# Patient Record
Sex: Male | Born: 1955 | Race: Black or African American | Hispanic: No | Marital: Married | Smoking: Current every day smoker
Health system: Southern US, Community
[De-identification: ages and names within clinical notes are randomized; demographics above are authoritative.]

## PROBLEM LIST (undated history)

## (undated) DIAGNOSIS — E876 Hypokalemia: Secondary | ICD-10-CM

## (undated) DIAGNOSIS — M899 Disorder of bone, unspecified: Secondary | ICD-10-CM

## (undated) DIAGNOSIS — E119 Type 2 diabetes mellitus without complications: Secondary | ICD-10-CM

## (undated) DIAGNOSIS — K831 Obstruction of bile duct: Secondary | ICD-10-CM

## (undated) DIAGNOSIS — E109 Type 1 diabetes mellitus without complications: Secondary | ICD-10-CM

## (undated) DIAGNOSIS — D329 Benign neoplasm of meninges, unspecified: Secondary | ICD-10-CM

## (undated) DIAGNOSIS — I10 Essential (primary) hypertension: Secondary | ICD-10-CM

## (undated) DIAGNOSIS — C250 Malignant neoplasm of head of pancreas: Secondary | ICD-10-CM

## (undated) DIAGNOSIS — K219 Gastro-esophageal reflux disease without esophagitis: Secondary | ICD-10-CM

## (undated) DIAGNOSIS — M51379 Other intervertebral disc degeneration, lumbosacral region without mention of lumbar back pain or lower extremity pain: Secondary | ICD-10-CM

## (undated) DIAGNOSIS — G629 Polyneuropathy, unspecified: Secondary | ICD-10-CM

## (undated) HISTORY — PX: EYE SURGERY: SHX253

## (undated) HISTORY — PX: CRANIOTOMY FOR TUMOR: SUR345

## (undated) HISTORY — PX: COLONOSCOPY: SHX174

## (undated) HISTORY — PX: ESOPHAGOGASTRODUODENOSCOPY: SHX1529

---

## 2012-12-24 ENCOUNTER — Ambulatory Visit: Payer: Self-pay | Admitting: Internal Medicine

## 2012-12-24 LAB — CBC CANCER CENTER
Basophil #: 0.1 x10 3/mm (ref 0.0–0.1)
Eosinophil %: 1.4 %
HCT: 45.2 % (ref 40.0–52.0)
Lymphocyte #: 4.1 x10 3/mm — ABNORMAL HIGH (ref 1.0–3.6)
MCH: 31.2 pg (ref 26.0–34.0)
MCV: 94 fL (ref 80–100)
Monocyte #: 1 x10 3/mm (ref 0.2–1.0)
Neutrophil #: 8.7 x10 3/mm — ABNORMAL HIGH (ref 1.4–6.5)
Neutrophil %: 61.5 %
Platelet: 158 x10 3/mm (ref 150–440)
RBC: 4.83 10*6/uL (ref 4.40–5.90)
RDW: 13.6 % (ref 11.5–14.5)
WBC: 14.1 x10 3/mm — ABNORMAL HIGH (ref 3.8–10.6)

## 2013-01-15 ENCOUNTER — Ambulatory Visit: Payer: Self-pay | Admitting: Internal Medicine

## 2013-01-21 LAB — IRON AND TIBC
Iron Bind.Cap.(Total): 382 ug/dL (ref 250–450)
Iron Saturation: 25 %
Iron: 97 ug/dL (ref 65–175)
Unbound Iron-Bind.Cap.: 285 ug/dL

## 2013-01-21 LAB — CBC CANCER CENTER
Basophil #: 0.1 x10 3/mm (ref 0.0–0.1)
Basophil %: 0.8 %
Eosinophil #: 0.2 x10 3/mm (ref 0.0–0.7)
Eosinophil %: 1.3 %
HCT: 44.6 % (ref 40.0–52.0)
HGB: 14.8 g/dL (ref 13.0–18.0)
Lymphocyte #: 5.1 x10 3/mm — ABNORMAL HIGH (ref 1.0–3.6)
Lymphocyte %: 39.3 %
MCH: 31.5 pg (ref 26.0–34.0)
MCHC: 33.1 g/dL (ref 32.0–36.0)
MCV: 95 fL (ref 80–100)
MONOS PCT: 7.2 %
Monocyte #: 0.9 x10 3/mm (ref 0.2–1.0)
NEUTROS ABS: 6.6 x10 3/mm — AB (ref 1.4–6.5)
Neutrophil %: 51.4 %
Platelet: 138 x10 3/mm — ABNORMAL LOW (ref 150–440)
RBC: 4.7 10*6/uL (ref 4.40–5.90)
RDW: 13.6 % (ref 11.5–14.5)
WBC: 12.9 x10 3/mm — AB (ref 3.8–10.6)

## 2013-01-21 LAB — FERRITIN: FERRITIN (ARMC): 384 ng/mL (ref 8–388)

## 2013-01-28 LAB — CBC CANCER CENTER
BASOS PCT: 1 %
Basophil #: 0.1 x10 3/mm (ref 0.0–0.1)
Eosinophil #: 0.2 x10 3/mm (ref 0.0–0.7)
Eosinophil %: 1.5 %
HCT: 43.3 % (ref 40.0–52.0)
HGB: 14.5 g/dL (ref 13.0–18.0)
LYMPHS ABS: 4.3 x10 3/mm — AB (ref 1.0–3.6)
LYMPHS PCT: 34.8 %
MCH: 31.7 pg (ref 26.0–34.0)
MCHC: 33.5 g/dL (ref 32.0–36.0)
MCV: 95 fL (ref 80–100)
Monocyte #: 0.9 x10 3/mm (ref 0.2–1.0)
Monocyte %: 7.5 %
NEUTROS PCT: 55.2 %
Neutrophil #: 6.9 x10 3/mm — ABNORMAL HIGH (ref 1.4–6.5)
PLATELETS: 137 x10 3/mm — AB (ref 150–440)
RBC: 4.57 10*6/uL (ref 4.40–5.90)
RDW: 14 % (ref 11.5–14.5)
WBC: 12.5 x10 3/mm — ABNORMAL HIGH (ref 3.8–10.6)

## 2013-02-11 LAB — CBC CANCER CENTER
BASOS ABS: 0.1 x10 3/mm (ref 0.0–0.1)
Basophil %: 0.9 %
Eosinophil #: 0.1 x10 3/mm (ref 0.0–0.7)
Eosinophil %: 1.5 %
HCT: 46.9 % (ref 40.0–52.0)
HGB: 15.7 g/dL (ref 13.0–18.0)
LYMPHS PCT: 30.2 %
Lymphocyte #: 2.9 x10 3/mm (ref 1.0–3.6)
MCH: 31.8 pg (ref 26.0–34.0)
MCHC: 33.5 g/dL (ref 32.0–36.0)
MCV: 95 fL (ref 80–100)
Monocyte #: 0.7 x10 3/mm (ref 0.2–1.0)
Monocyte %: 7.7 %
NEUTROS ABS: 5.7 x10 3/mm (ref 1.4–6.5)
Neutrophil %: 59.7 %
PLATELETS: 158 x10 3/mm (ref 150–440)
RBC: 4.94 10*6/uL (ref 4.40–5.90)
RDW: 13.7 % (ref 11.5–14.5)
WBC: 9.6 x10 3/mm (ref 3.8–10.6)

## 2013-02-11 IMAGING — US ABDOMEN ULTRASOUND
1 series · 14 of 25 positions shown · non-contrast
Comparison: None.

CLINICAL DATA: Thrombocytopenia

EXAM:
ULTRASOUND ABDOMEN COMPLETE

[Series 1: abdomen ultrasound · 0.28mm/px · 14 of 90 slices shown]
[im 1/90]
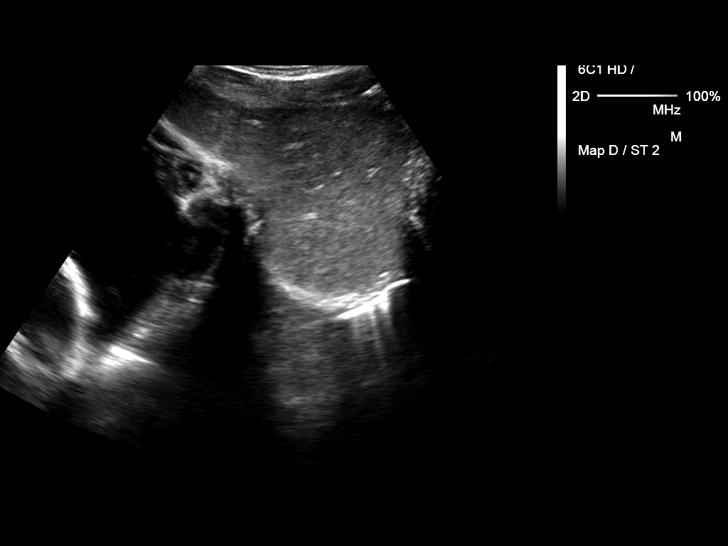
[im 8/90]
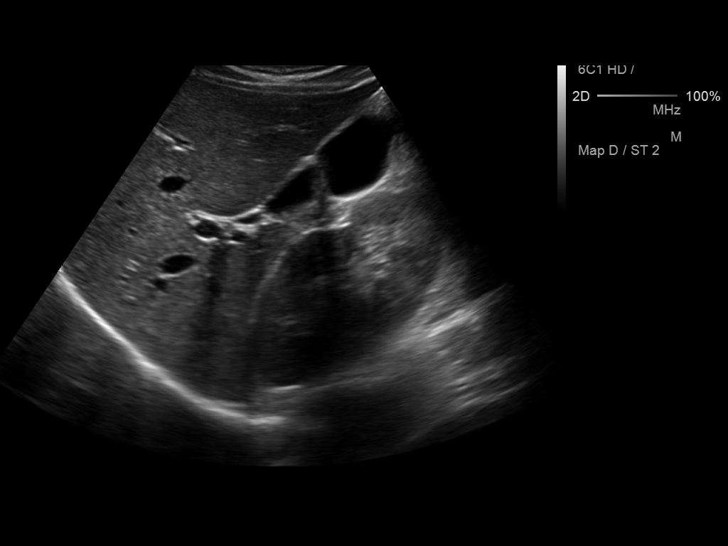
[im 15/90]
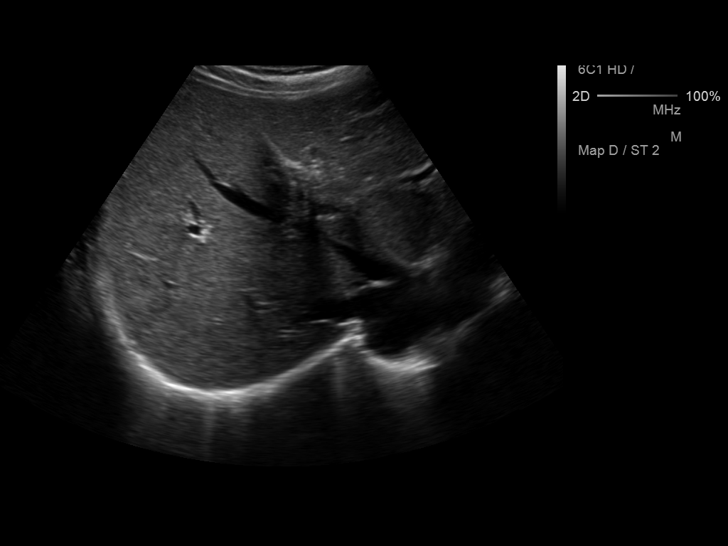
[im 23/90]
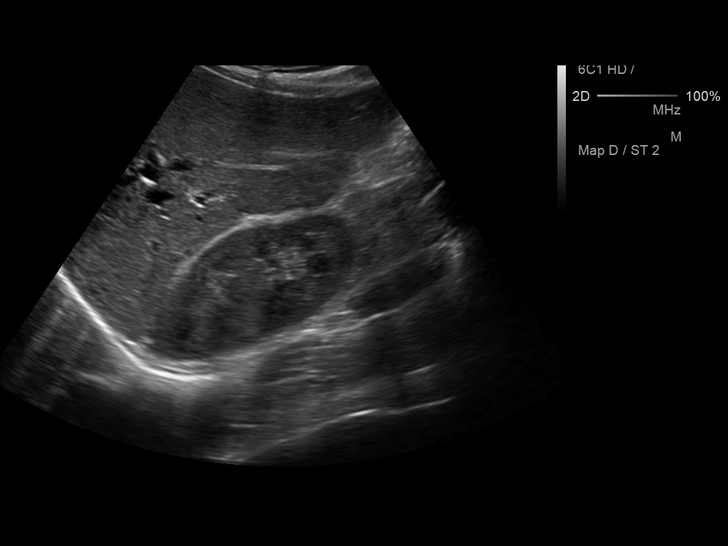
[im 30/90]
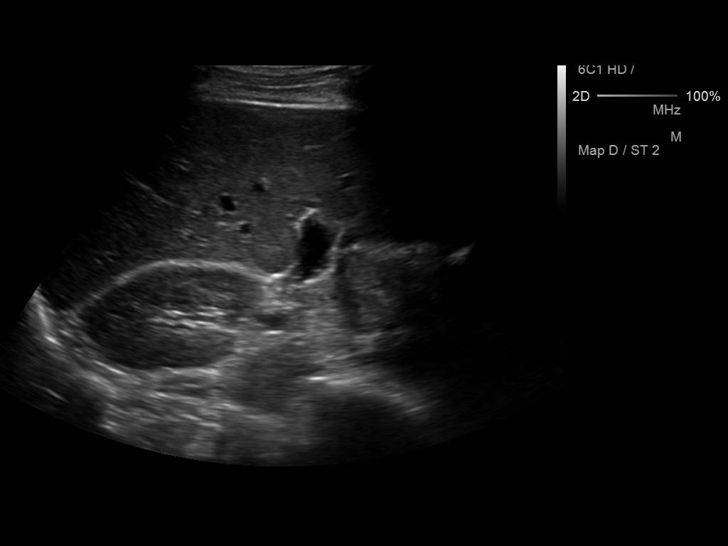
[im 34/90]
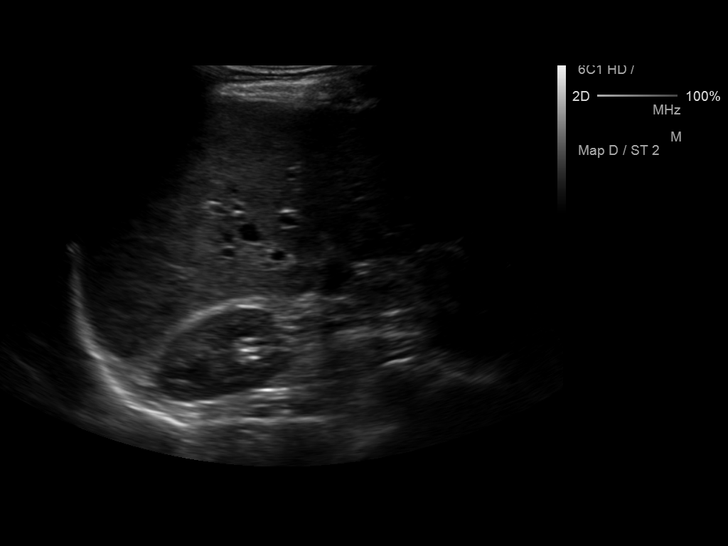
[im 41/90]
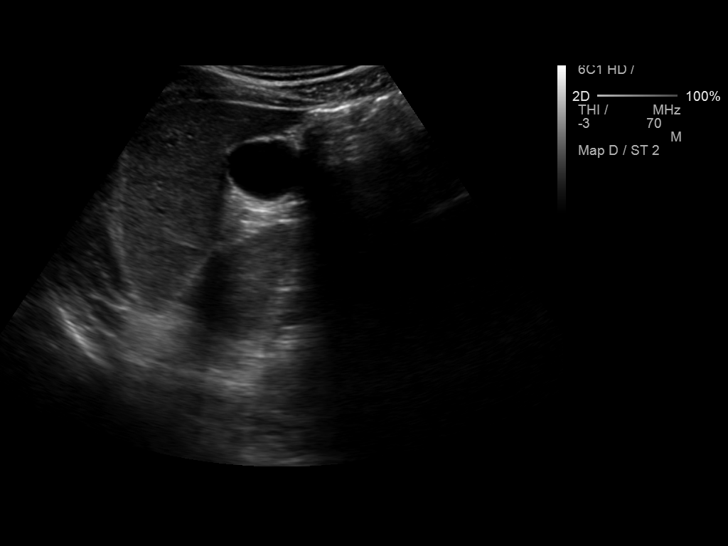
[im 49/90]
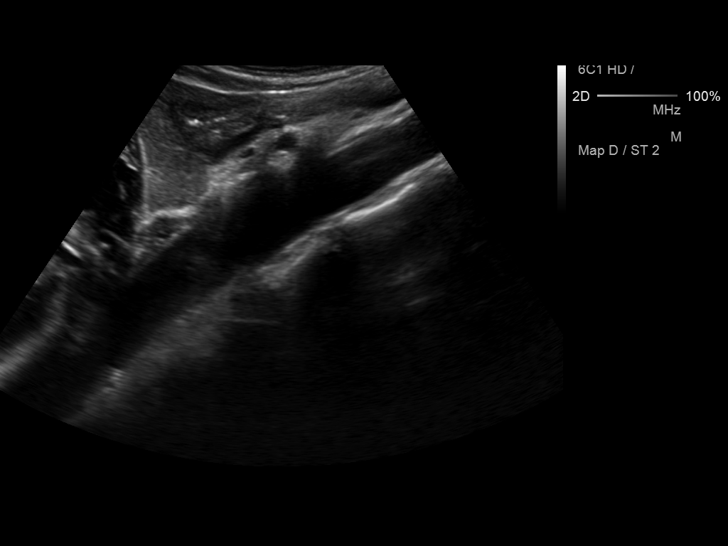
[im 56/90]
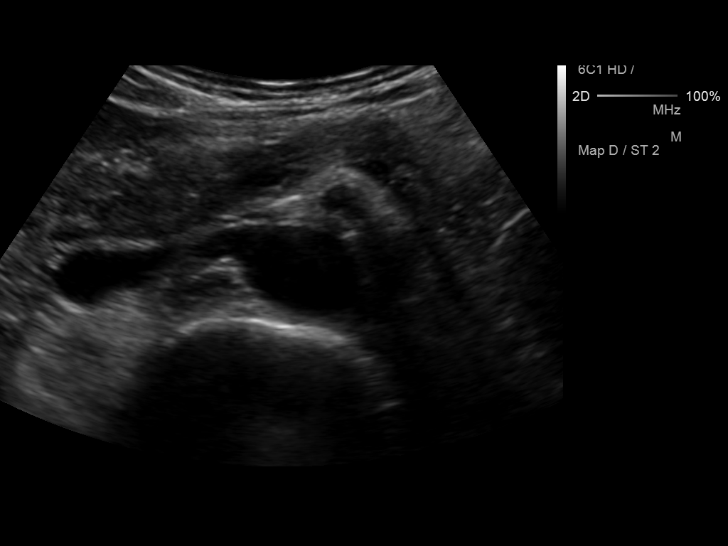
[im 60/90]
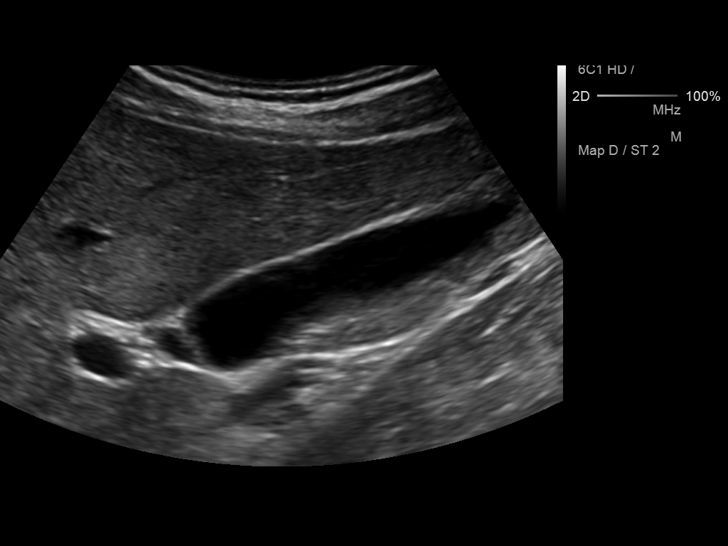
[im 67/90]
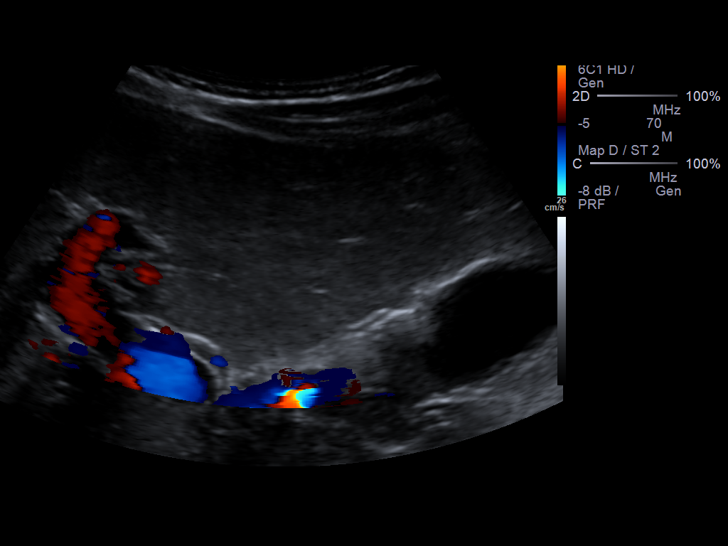
[im 75/90]
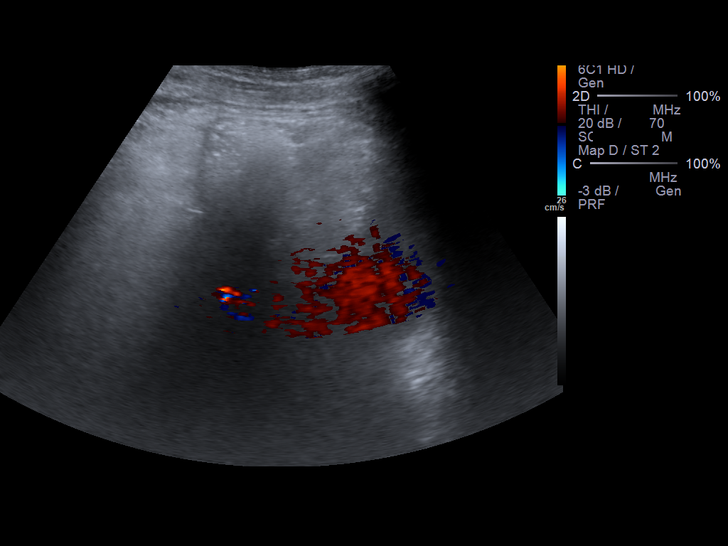
[im 82/90]
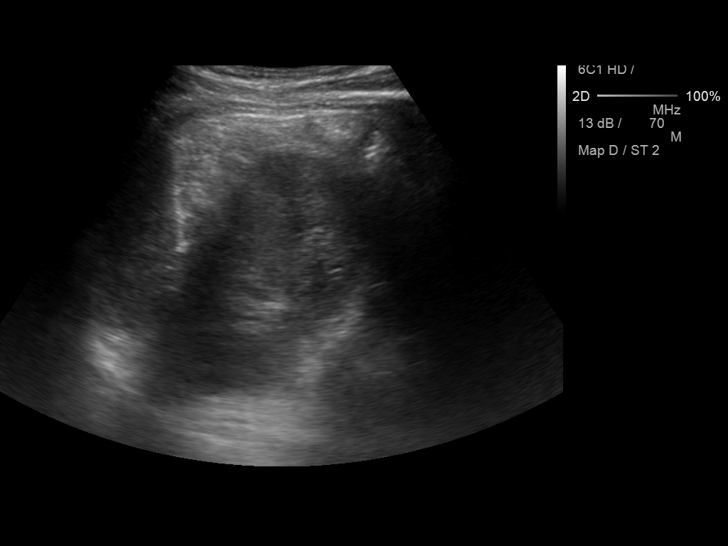
[im 90/90]
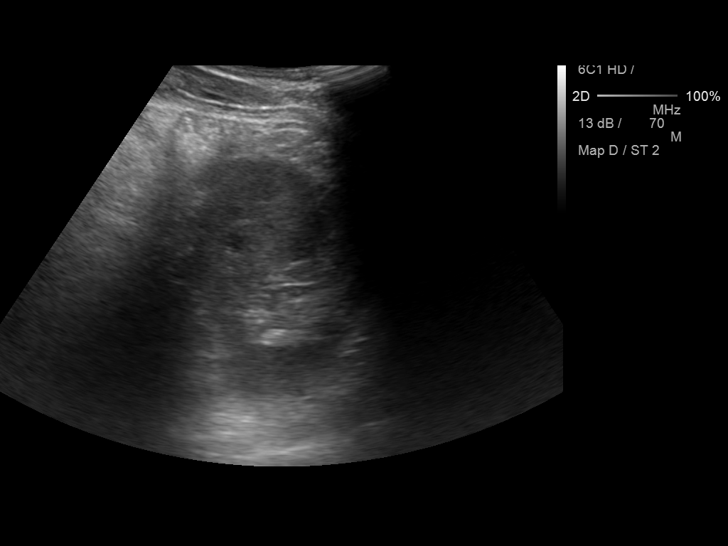

[14 of 25 positions shown; findings below may reference images not displayed]

FINDINGS: Gallbladder:

No gallstones or wall thickening visualized. No sonographic Murphy
sign noted.

Common bile duct:

Diameter: 1.7 mm.

Liver:

No focal lesion identified. Within normal limits in parenchymal
echogenicity.

IVC:

No abnormality visualized.

Pancreas:

Visualized portion unremarkable.

Spleen:

Size and appearance within normal limits.

Right Kidney:

Length: 10.2 cm. Echogenicity within normal limits. No mass or
hydronephrosis visualized.

Left Kidney:

Length: 8.9 cm. Echogenicity within normal limits. No mass or
hydronephrosis visualized.

Abdominal aorta:

No aneurysm visualized.

Other findings:

None.
IMPRESSION: Unremarkable abdominal ultrasound.

## 2013-02-15 ENCOUNTER — Ambulatory Visit: Payer: Self-pay | Admitting: Internal Medicine

## 2013-03-04 LAB — CBC CANCER CENTER
BASOS ABS: 0.1 x10 3/mm (ref 0.0–0.1)
BASOS PCT: 0.7 %
EOS PCT: 0.6 %
Eosinophil #: 0.1 x10 3/mm (ref 0.0–0.7)
HCT: 46.8 % (ref 40.0–52.0)
HGB: 15.9 g/dL (ref 13.0–18.0)
LYMPHS PCT: 27.2 %
Lymphocyte #: 3.3 x10 3/mm (ref 1.0–3.6)
MCH: 32.3 pg (ref 26.0–34.0)
MCHC: 34 g/dL (ref 32.0–36.0)
MCV: 95 fL (ref 80–100)
Monocyte #: 1 x10 3/mm (ref 0.2–1.0)
Monocyte %: 8.1 %
NEUTROS PCT: 63.4 %
Neutrophil #: 7.8 x10 3/mm — ABNORMAL HIGH (ref 1.4–6.5)
Platelet: 150 x10 3/mm (ref 150–440)
RBC: 4.92 10*6/uL (ref 4.40–5.90)
RDW: 13.7 % (ref 11.5–14.5)
WBC: 12.3 x10 3/mm — ABNORMAL HIGH (ref 3.8–10.6)

## 2013-03-15 ENCOUNTER — Ambulatory Visit: Payer: Self-pay | Admitting: Internal Medicine

## 2013-05-05 ENCOUNTER — Ambulatory Visit: Payer: Self-pay | Admitting: Internal Medicine

## 2013-05-06 LAB — CBC CANCER CENTER
Basophil #: 0.1 x10 3/mm (ref 0.0–0.1)
Basophil %: 0.9 %
EOS PCT: 1.5 %
Eosinophil #: 0.2 x10 3/mm (ref 0.0–0.7)
HCT: 44.6 % (ref 40.0–52.0)
HGB: 14.8 g/dL (ref 13.0–18.0)
Lymphocyte #: 3.9 x10 3/mm — ABNORMAL HIGH (ref 1.0–3.6)
Lymphocyte %: 29.8 %
MCH: 31.4 pg (ref 26.0–34.0)
MCHC: 33.1 g/dL (ref 32.0–36.0)
MCV: 95 fL (ref 80–100)
MONO ABS: 0.7 x10 3/mm (ref 0.2–1.0)
MONOS PCT: 5.6 %
NEUTROS PCT: 62.2 %
Neutrophil #: 8.2 x10 3/mm — ABNORMAL HIGH (ref 1.4–6.5)
Platelet: 151 x10 3/mm (ref 150–440)
RBC: 4.71 10*6/uL (ref 4.40–5.90)
RDW: 13.3 % (ref 11.5–14.5)
WBC: 13.2 x10 3/mm — ABNORMAL HIGH (ref 3.8–10.6)

## 2013-05-15 ENCOUNTER — Ambulatory Visit: Payer: Self-pay | Admitting: Internal Medicine

## 2013-10-07 ENCOUNTER — Ambulatory Visit: Payer: Self-pay | Admitting: Internal Medicine

## 2013-10-28 ENCOUNTER — Ambulatory Visit: Payer: Self-pay | Admitting: Internal Medicine

## 2013-10-28 LAB — CBC CANCER CENTER
Basophil #: 0.1 x10 3/mm (ref 0.0–0.1)
Basophil %: 0.8 %
EOS PCT: 0.8 %
Eosinophil #: 0.1 x10 3/mm (ref 0.0–0.7)
HCT: 43.4 % (ref 40.0–52.0)
HGB: 14.1 g/dL (ref 13.0–18.0)
Lymphocyte #: 2.9 x10 3/mm (ref 1.0–3.6)
Lymphocyte %: 26.7 %
MCH: 31.4 pg (ref 26.0–34.0)
MCHC: 32.6 g/dL (ref 32.0–36.0)
MCV: 96 fL (ref 80–100)
MONOS PCT: 6.5 %
Monocyte #: 0.7 x10 3/mm (ref 0.2–1.0)
NEUTROS ABS: 7.2 x10 3/mm — AB (ref 1.4–6.5)
Neutrophil %: 65.2 %
PLATELETS: 158 x10 3/mm (ref 150–440)
RBC: 4.51 10*6/uL (ref 4.40–5.90)
RDW: 13.6 % (ref 11.5–14.5)
WBC: 11 x10 3/mm — ABNORMAL HIGH (ref 3.8–10.6)

## 2013-11-15 ENCOUNTER — Ambulatory Visit: Payer: Self-pay | Admitting: Internal Medicine

## 2015-03-30 ENCOUNTER — Encounter: Payer: Self-pay | Admitting: Medical Oncology

## 2015-03-30 ENCOUNTER — Emergency Department
Admission: EM | Admit: 2015-03-30 | Discharge: 2015-03-30 | Disposition: A | Discharge status: Home / Self Care | Payer: Managed Care, Other (non HMO) | Attending: Emergency Medicine | Admitting: Emergency Medicine

## 2015-03-30 DIAGNOSIS — F17200 Nicotine dependence, unspecified, uncomplicated: Secondary | ICD-10-CM | POA: Diagnosis not present

## 2015-03-30 DIAGNOSIS — I10 Essential (primary) hypertension: Secondary | ICD-10-CM | POA: Diagnosis not present

## 2015-03-30 DIAGNOSIS — R42 Dizziness and giddiness: Secondary | ICD-10-CM | POA: Diagnosis present

## 2015-03-30 DIAGNOSIS — H8110 Benign paroxysmal vertigo, unspecified ear: Secondary | ICD-10-CM | POA: Diagnosis not present

## 2015-03-30 HISTORY — DX: Essential (primary) hypertension: I10

## 2015-03-30 LAB — CBC WITH DIFFERENTIAL/PLATELET
BASOS PCT: 1 %
Basophils Absolute: 0.1 10*3/uL (ref 0–0.1)
EOS ABS: 0.2 10*3/uL (ref 0–0.7)
Eosinophils Relative: 1 %
HCT: 45.6 % (ref 40.0–52.0)
HEMOGLOBIN: 15.7 g/dL (ref 13.0–18.0)
Lymphocytes Relative: 27 %
Lymphs Abs: 3.5 10*3/uL (ref 1.0–3.6)
MCH: 32.2 pg (ref 26.0–34.0)
MCHC: 34.5 g/dL (ref 32.0–36.0)
MCV: 93.3 fL (ref 80.0–100.0)
MONOS PCT: 7 %
Monocytes Absolute: 0.9 10*3/uL (ref 0.2–1.0)
NEUTROS PCT: 64 %
Neutro Abs: 8.4 10*3/uL — ABNORMAL HIGH (ref 1.4–6.5)
Platelets: 159 10*3/uL (ref 150–440)
RBC: 4.88 MIL/uL (ref 4.40–5.90)
RDW: 13.7 % (ref 11.5–14.5)
WBC: 13 10*3/uL — AB (ref 3.8–10.6)

## 2015-03-30 LAB — BASIC METABOLIC PANEL
Anion gap: 10 (ref 5–15)
BUN: 12 mg/dL (ref 6–20)
CALCIUM: 9.7 mg/dL (ref 8.9–10.3)
CHLORIDE: 97 mmol/L — AB (ref 101–111)
CO2: 27 mmol/L (ref 22–32)
CREATININE: 1.17 mg/dL (ref 0.61–1.24)
Glucose, Bld: 94 mg/dL (ref 65–99)
Potassium: 2.8 mmol/L — CL (ref 3.5–5.1)
SODIUM: 134 mmol/L — AB (ref 135–145)

## 2015-03-30 MED ORDER — POTASSIUM CHLORIDE CRYS ER 20 MEQ PO TBCR
40.0000 meq | EXTENDED_RELEASE_TABLET | Freq: Once | ORAL | Status: AC
  Administered 2015-03-30: 40 meq via ORAL
  Filled 2015-03-30: qty 2

## 2015-03-30 MED ORDER — MECLIZINE HCL 25 MG PO TABS
25.0000 mg | ORAL_TABLET | Freq: Once | ORAL | Status: AC
  Administered 2015-03-30: 25 mg via ORAL
  Filled 2015-03-30: qty 1

## 2015-03-30 MED ORDER — SODIUM CHLORIDE 0.9 % IV BOLUS (SEPSIS)
1000.0000 mL | Freq: Once | INTRAVENOUS | Status: AC
  Administered 2015-03-30: 1000 mL via INTRAVENOUS

## 2015-03-30 MED ORDER — MECLIZINE HCL 25 MG PO TABS: 25.0000 mg | ORAL_TABLET | Freq: Three times a day (TID) | ORAL | Status: DC | PRN

## 2015-03-30 NOTE — ED Notes (Signed)
Pt reports that he has been having dizziness that began this am, worsens with turning his head. Pt denies other sx's.

## 2015-03-30 NOTE — ED Notes (Signed)
Pt presents with dizziness upon waking; states it is worse when he turns his head. No neuro deficits noted. Pt alert & oriented.

## 2015-03-30 NOTE — Discharge Instructions (Signed)
Please seek medical attention for any high fevers, chest pain, shortness of breath, change in behavior, persistent vomiting, bloody stool or any other new or concerning symptoms.   Vertigo Vertigo means that you feel like you are moving when you are not. Vertigo can also make you feel like things around you are moving when they are not. This feeling can come and go at any time. Vertigo often goes away on its own. HOME CARE 1. Avoid making fast movements. 2. Avoid driving. 3. Avoid using heavy machinery. 4. Avoid doing any task or activity that might cause danger to you or other people if you would have a vertigo attack while you are doing it. 5. Sit down right away if you feel dizzy or have trouble with your balance. 6. Take over-the-counter and prescription medicines only as told by your doctor. 7. Follow instructions from your doctor about which positions or movements you should avoid. 8. Drink enough fluid to keep your pee (urine) clear or pale yellow. 9. Keep all follow-up visits as told by your doctor. This is important. GET HELP IF:  Medicine does not help your vertigo.  You have a fever.  Your problems get worse or you have new symptoms.  Your family or friends see changes in your behavior.  You feel sick to your stomach (nauseous) or you throw up (vomit).  You have a "pins and needles" feeling or you are numb in part of your body. GET HELP RIGHT AWAY IF:  You have trouble moving or talking.  You are always dizzy.  You pass out (faint).  You get very bad headaches.  You feel weak or have trouble using your hands, arms, or legs.  You have changes in your hearing.  You have changes in your seeing (vision).  You get a stiff neck.  Bright light starts to bother you.   This information is not intended to replace advice given to you by your health care provider. Make sure you discuss any questions you have with your health care provider.   Document Released:  10/11/2007 Document Revised: 09/22/2014 Document Reviewed: 04/26/2014 Elsevier Interactive Patient Education 2016 [employer].  Teaching laboratory technician Self-Care WHAT IS THE EPLEY MANEUVER? The Epley maneuver is an exercise you can do to relieve symptoms of benign paroxysmal positional vertigo (BPPV). This condition is often just referred to as vertigo. BPPV is caused by the movement of tiny crystals (canaliths) inside your inner ear. The accumulation and movement of canaliths in your inner ear causes a sudden spinning sensation (vertigo) when you move your head to certain positions. Vertigo usually lasts about 30 seconds. BPPV usually occurs in just one ear. If you get vertigo when you lie on your left side, you probably have BPPV in your left ear. Your health care provider can tell you which ear is involved.  BPPV may be caused by a head injury. Many people older than 50 get BPPV for unknown reasons. If you have been diagnosed with BPPV, your health care provider may teach you how to do this maneuver. BPPV is not life threatening (benign) and usually goes away in time.  WHEN SHOULD I PERFORM THE EPLEY MANEUVER? You can do this maneuver at home whenever you have symptoms of vertigo. You may do the Epley maneuver up to 3 times a day until your symptoms of vertigo go away. HOW SHOULD I DO THE EPLEY MANEUVER? 10. Sit on the edge of a bed or table with your back straight. Your legs should  be extended or hanging over the edge of the bed or table.  11. Turn your head halfway toward the affected ear.  12. Lie backward quickly with your head turned until you are lying flat on your back. You may want to position a pillow under your shoulders.  13. Hold this position for 30 seconds. You may experience an attack of vertigo. This is normal. Hold this position until the vertigo stops. 14. Then turn your head to the opposite direction until your unaffected ear is facing the floor.  15. Hold this position for 30  seconds. You may experience an attack of vertigo. This is normal. Hold this position until the vertigo stops. 16. Now turn your whole body to the same side as your head. Hold for another 30 seconds.  17. You can then sit back up. ARE THERE RISKS TO THIS MANEUVER? In some cases, you may have other symptoms (such as changes in your vision, weakness, or numbness). If you have these symptoms, stop doing the maneuver and call your health care provider. Even if doing these maneuvers relieves your vertigo, you may still have dizziness. Dizziness is the sensation of light-headedness but without the sensation of movement. Even though the Epley maneuver may relieve your vertigo, it is possible that your symptoms will return within 5 years. WHAT SHOULD I DO AFTER THIS MANEUVER? After doing the Epley maneuver, you can return to your normal activities. Ask your doctor if there is anything you should do at home to prevent vertigo. This may include:  Sleeping with two or more pillows to keep your head elevated.  Not sleeping on the side of your affected ear.  Getting up slowly from bed.  Avoiding sudden movements during the day.  Avoiding extreme head movement, like looking up or bending over.  Wearing a cervical collar to prevent sudden head movements. WHAT SHOULD I DO IF MY SYMPTOMS GET WORSE? Call your health care provider if your vertigo gets worse. Call your provider right way if you have other symptoms, including:   Nausea.  Vomiting.  Headache.  Weakness.  Numbness.  Vision changes.   This information is not intended to replace advice given to you by your health care provider. Make sure you discuss any questions you have with your health care provider.   Document Released: 01/06/2013 Document Reviewed: 01/06/2013 Elsevier Interactive Patient Education [employer] Elsevier Inc.

## 2015-03-30 NOTE — ED Provider Notes (Signed)
[facility] Emergency Department Provider Note   ____________________________________________  Time seen: ~0800  I have reviewed the triage vital signs and the nursing notes.   HISTORY  Chief Complaint Dizziness   History limited by: Not Limited   HPI Samuel Lynch is a 60 y.o. male who presents to the emergency department today because of concerns for dizziness. The patient states that it started this morning. It is present when he moves his head or sits up. It will then go away. It is not present when he is lying still. He describes it as a sense of the room spinning around him. He denies any headache. Denies any chest pain. Denies any recent illnesses, fevers, colds or congestion. Denies any decrease in oral intake or vomiting or diarrhea.    Past Medical History  Diagnosis Date  . Hypertension     There are no active problems to display for this patient.   History reviewed. No pertinent past surgical history.  No current outpatient prescriptions on file.  Allergies Review of patient's allergies indicates no known allergies.  No family history on file.  Social History Social History  Substance Use Topics  . Smoking status: Current Every Day Smoker  . Smokeless tobacco: None  . Alcohol Use: Yes    Review of Systems  Constitutional: Negative for fever. Cardiovascular: Negative for chest pain. Respiratory: Negative for shortness of breath. Gastrointestinal: Negative for abdominal pain, vomiting and diarrhea. Neurological: Positive for dizziness  10-point ROS otherwise negative.  ____________________________________________   PHYSICAL EXAM:  VITAL SIGNS: ED Triage Vitals  Enc Vitals Group     BP 03/30/15 0745 153/82 mmHg     Pulse Rate 03/30/15 0745 54     Resp --      Temp 03/30/15 0745 97.8 F (36.6 C)     Temp Source 03/30/15 0745 Oral     SpO2 03/30/15 0745 98 %     Weight 03/30/15 0745 145 lb (65.772 kg)     Height  03/30/15 0745 5\' 8"  (1.727 m)   Constitutional: Alert and oriented. Well appearing and in no distress. Eyes: Conjunctivae are normal. PERRL. Normal extraocular movements. ENT   Head: Normocephalic and atraumatic.   Nose: No congestion/rhinnorhea.   Mouth/Throat: Mucous membranes are moist.   Neck: No stridor. Hematological/Lymphatic/Immunilogical: No cervical lymphadenopathy. Cardiovascular: Normal rate, regular rhythm.  No murmurs, rubs, or gallops. Respiratory: Normal respiratory effort without tachypnea nor retractions. Breath sounds are clear and equal bilaterally. No wheezes/rales/rhonchi. Gastrointestinal: Soft and nontender. No distention.  Genitourinary: Deferred Musculoskeletal: Normal range of motion in all extremities. No joint effusions.  No lower extremity tenderness nor edema. Neurologic:  Normal speech and language. Slight dizziness elicited with bilateral Dix-Hallpike. Skin:  Skin is warm, dry and intact. No rash noted. Psychiatric: Mood and affect are normal. Speech and behavior are normal. Patient exhibits appropriate insight and judgment.  ____________________________________________    LABS (pertinent positives/negatives)  Labs Reviewed  CBC WITH DIFFERENTIAL/PLATELET - Abnormal; Notable for the following:    WBC 13.0 (*)    Neutro Abs 8.4 (*)    All other components within normal limits  BASIC METABOLIC PANEL - Abnormal; Notable for the following:    Sodium 134 (*)    Potassium 2.8 (*)    Chloride 97 (*)    All other components within normal limits     ____________________________________________   EKG  I, Phineas SemenGraydon Noemi Ishmael, attending physician, personally viewed and interpreted this EKG  EKG Time: 0749 Rate:  51 Rhythm: sinus bradycardia Axis: normal Intervals: qtc 437 QRS: narrow ST changes: no st elevation Impression: sinus bradycardia   ____________________________________________     RADIOLOGY  None  ____________________________________________   PROCEDURES  Procedure(s) performed: None  Critical Care performed: No  ____________________________________________   INITIAL IMPRESSION / ASSESSMENT AND PLAN / ED COURSE  Pertinent labs & imaging results that were available during my care of the patient were reviewed by me and considered in my medical decision making (see chart for details).  Patient presented to the emergency department today because of concerns for dizziness. It is worse when the patient sits up or moves his head. I does then go away. On exam patient had mild dizziness elicited with Dix-Hallpike. This point I think likely patient suffering from in her ear vertigo likely PP BV. I doubt central lesion given the fact that it goes away and is not present at rest. Patient was given meclizine and fluids here. Will give prescription for meclizine. Will give patient ENT follow-up.  ____________________________________________   FINAL CLINICAL IMPRESSION(S) / ED DIAGNOSES  Final diagnoses:  BPPV (benign paroxysmal positional vertigo), unspecified laterality     Phineas Semen, MD 03/30/15 [phone]

## 2016-10-08 ENCOUNTER — Other Ambulatory Visit: Payer: Self-pay | Admitting: Internal Medicine

## 2016-10-15 ENCOUNTER — Ambulatory Visit
Admission: RE | Admit: 2016-10-15 | Discharge: 2016-10-15 | Disposition: A | Discharge status: Home / Self Care | Payer: 59 | Source: Ambulatory Visit | Attending: Internal Medicine | Admitting: Internal Medicine

## 2016-10-15 DIAGNOSIS — M5030 Other cervical disc degeneration, unspecified cervical region: Secondary | ICD-10-CM | POA: Insufficient documentation

## 2016-10-15 IMAGING — CR DG BONE SURVEY MET
7 of 10 series · 7 of 10 positions shown · non-contrast
Comparison: None.

CLINICAL DATA: Hypercalcemia

EXAM:
METASTATIC BONE SURVEY

[chest pa]
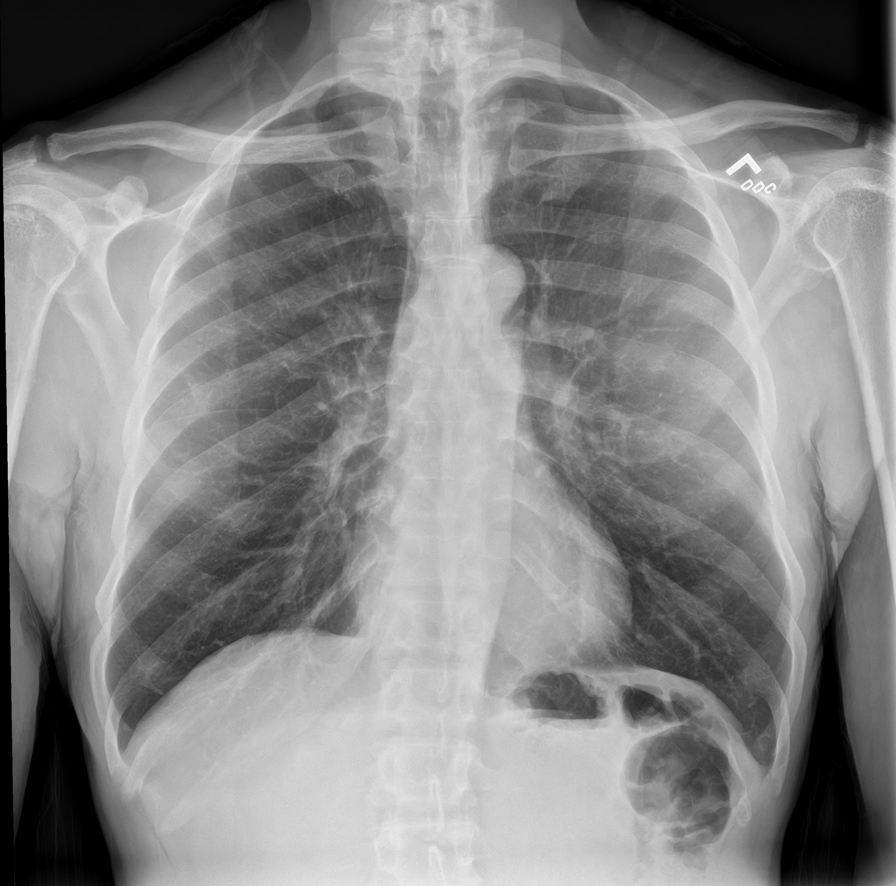

[c-spine lat]
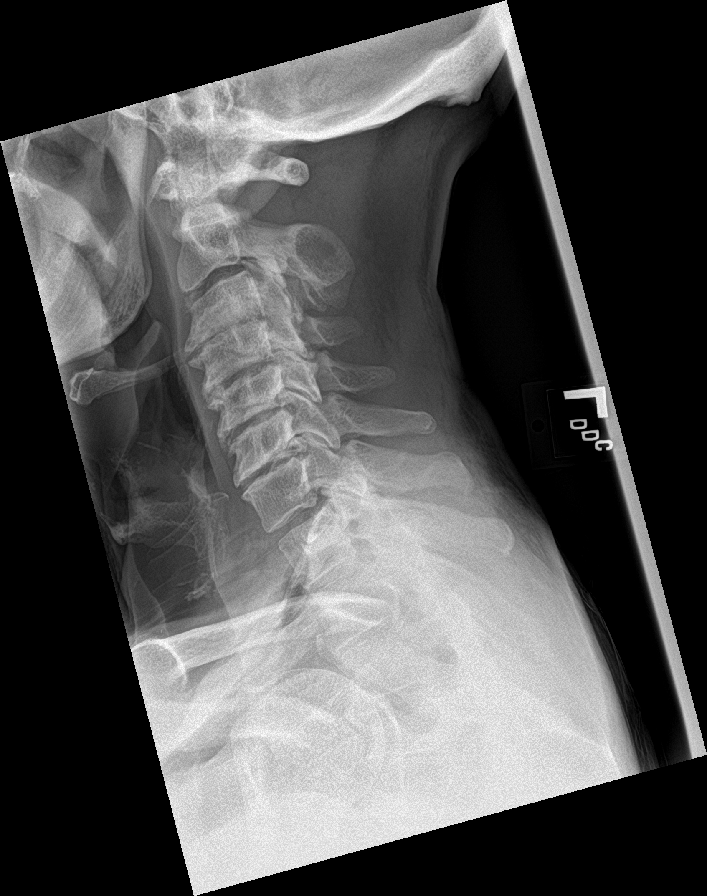

[skull lat]
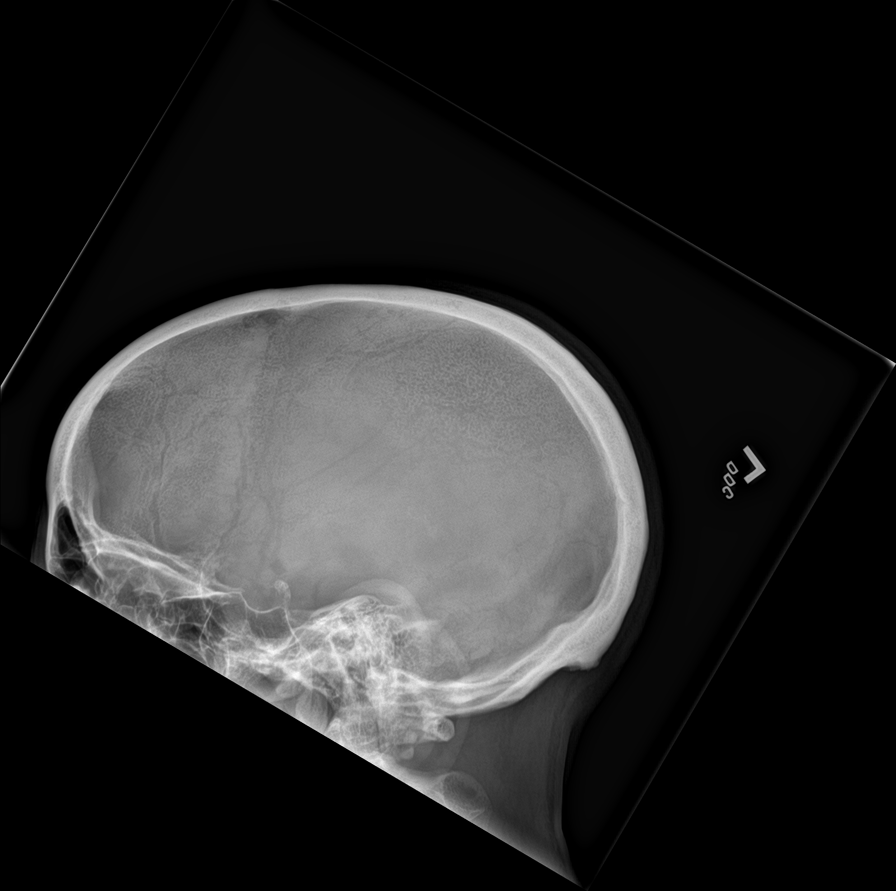

[c-spine ap]
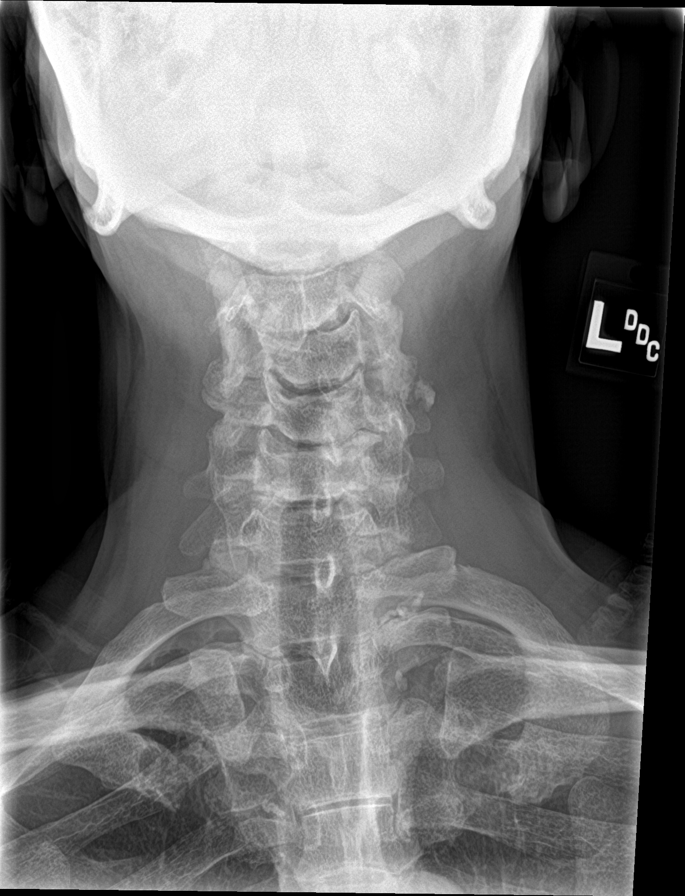

[shoulder ap (1 of 2)]
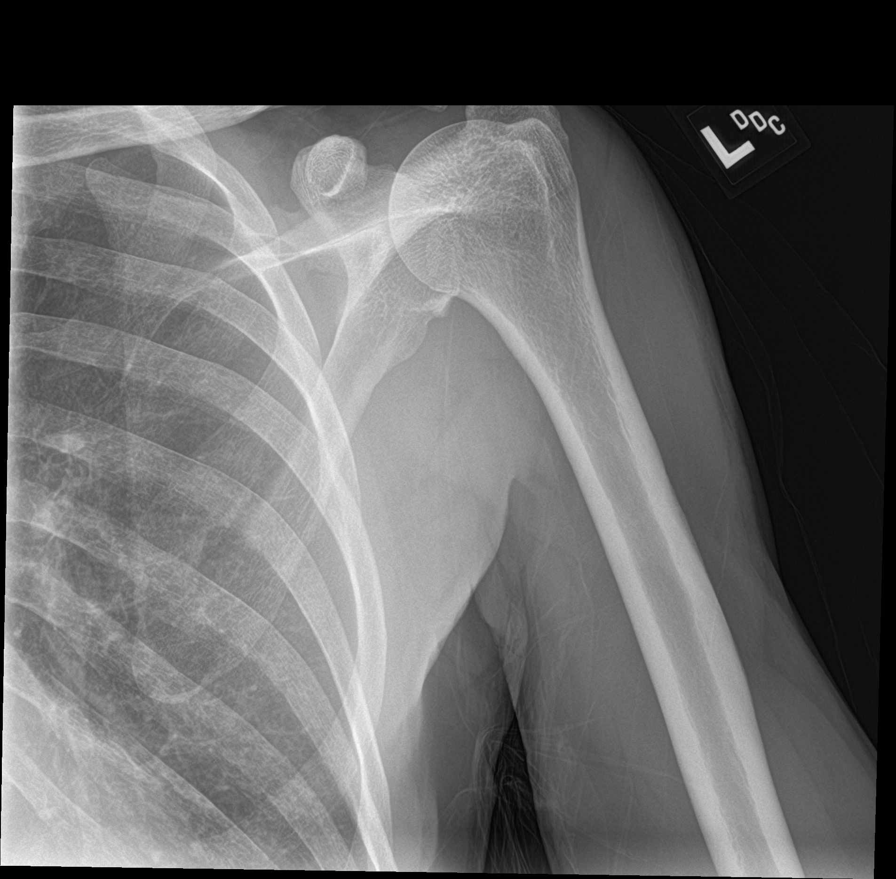

[shoulder ap (2 of 2)]
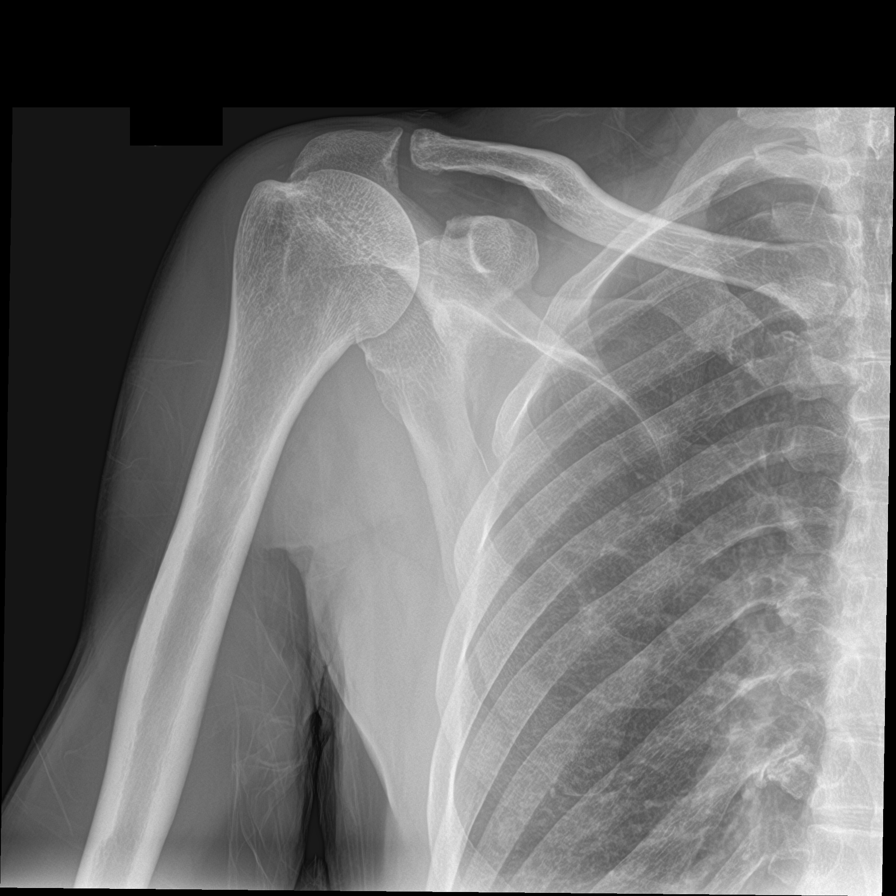

[humerus ap]
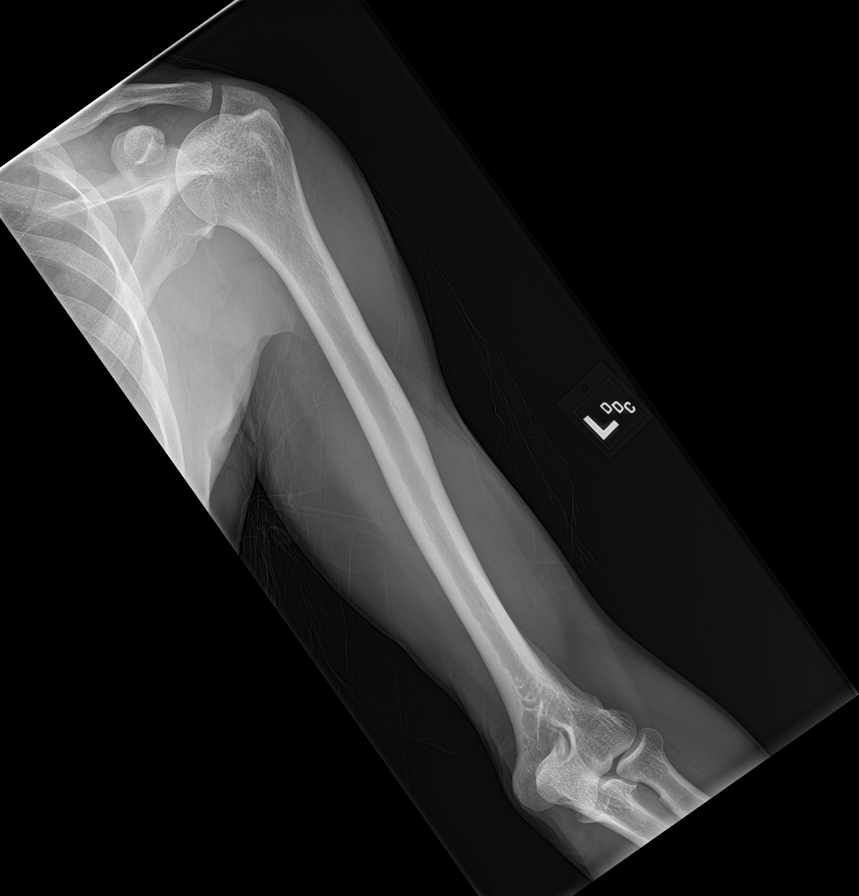

[7 of 10 positions shown; findings below may reference images not displayed]

FINDINGS: Frontal view of the chest reveals no lytic or sclerotic lesion. The
cervical spine demonstrates multilevel degenerative changes from
C3-C7 with disc space narrowing and osteophytic formation.

Lateral view of the skull demonstrates no lytic lesions.

The shoulder girdles and upper extremities show no lytic or
sclerotic lesions. Some soft tissue calcification is noted adjacent
to the radioulnar articulation in the distal left forearm.

The thoracic and lumbar spine demonstrate no compression
deformities. No paraspinal mass is noted. Mild degenerative change
is noted. Pelvis is within normal limits.

The lower extremities are well visualized bilaterally without focal
abnormality.
IMPRESSION: Degenerative change in the spine worst in the cervical region as
described.

No lytic or sclerotic lesions are noted.

## 2018-09-17 ENCOUNTER — Other Ambulatory Visit: Payer: Self-pay

## 2018-09-17 DIAGNOSIS — Z20822 Contact with and (suspected) exposure to covid-19: Secondary | ICD-10-CM

## 2018-09-18 ENCOUNTER — Telehealth: Payer: Self-pay | Admitting: *Deleted

## 2018-09-18 LAB — NOVEL CORONAVIRUS, NAA: SARS-CoV-2, NAA: NOT DETECTED

## 2018-09-18 NOTE — Telephone Encounter (Signed)
Pt notified of negative covid results  

## 2019-06-08 ENCOUNTER — Telehealth: Payer: Self-pay | Admitting: *Deleted

## 2019-06-08 DIAGNOSIS — Z122 Encounter for screening for malignant neoplasm of respiratory organs: Secondary | ICD-10-CM

## 2019-06-08 DIAGNOSIS — Z87891 Personal history of nicotine dependence: Secondary | ICD-10-CM

## 2019-06-08 NOTE — Telephone Encounter (Signed)
Received referral for initial lung cancer screening scan. Contacted patient and obtained smoking history,(current, 47 pack year) as well as answering questions related to screening process. Patient denies signs of lung cancer such as weight loss or hemoptysis. Patient denies comorbidity that would prevent curative treatment if lung cancer were found. Patient is scheduled for shared decision making visit and CT scan on 06/23/19 at 2pm.

## 2019-06-19 ENCOUNTER — Telehealth: Payer: Self-pay | Admitting: *Deleted

## 2019-06-19 NOTE — Telephone Encounter (Signed)
Left message with patient that appts scheduled in the lung cancer screening program on 06/23/19 are being cancelled due to insurance being out of network and pt does not have any out of network benefits at this time. Informed that will discuss with Shawn next week to determine if can be scheduled at a different facility where insurance will be in-network. Instructed to call back to ensure pt received my message.

## 2019-06-23 ENCOUNTER — Inpatient Hospital Stay: Payer: Managed Care, Other (non HMO) | Admitting: Oncology

## 2019-06-23 ENCOUNTER — Ambulatory Visit: Payer: Managed Care, Other (non HMO)

## 2019-09-30 ENCOUNTER — Telehealth: Payer: Self-pay | Admitting: *Deleted

## 2019-09-30 NOTE — Telephone Encounter (Signed)
Attempted to contact patient to assist if possible with identifying an in network facility for lung cancer screening. Voicemail left for patient to call back.

## 2019-11-20 ENCOUNTER — Other Ambulatory Visit: Payer: Self-pay | Admitting: Family Medicine

## 2019-11-20 ENCOUNTER — Ambulatory Visit: Payer: Managed Care, Other (non HMO)

## 2019-11-20 ENCOUNTER — Other Ambulatory Visit: Payer: Self-pay

## 2019-11-20 DIAGNOSIS — R748 Abnormal levels of other serum enzymes: Secondary | ICD-10-CM

## 2019-11-20 DIAGNOSIS — R17 Unspecified jaundice: Secondary | ICD-10-CM

## 2022-07-11 LAB — EXTERNAL GENERIC LAB PROCEDURE

## 2022-08-07 LAB — EXTERNAL GENERIC LAB PROCEDURE: COLOGUARD: POSITIVE — AB

## 2022-11-27 ENCOUNTER — Encounter: Payer: Self-pay | Admitting: *Deleted

## 2022-12-03 ENCOUNTER — Encounter: Payer: Self-pay | Admitting: *Deleted

## 2022-12-10 ENCOUNTER — Other Ambulatory Visit: Payer: Self-pay

## 2022-12-10 ENCOUNTER — Ambulatory Visit: Payer: 59 | Admitting: General Practice

## 2022-12-10 ENCOUNTER — Encounter
Admission: RE | Disposition: A | Discharge status: Home / Self Care | Payer: Self-pay | Source: Home / Self Care | Attending: Gastroenterology

## 2022-12-10 ENCOUNTER — Encounter: Payer: Self-pay | Admitting: *Deleted

## 2022-12-10 ENCOUNTER — Ambulatory Visit
Admission: RE | Admit: 2022-12-10 | Discharge: 2022-12-10 | Disposition: A | Discharge status: Home / Self Care | Payer: 59 | Attending: Gastroenterology | Admitting: Gastroenterology

## 2022-12-10 DIAGNOSIS — Z794 Long term (current) use of insulin: Secondary | ICD-10-CM | POA: Diagnosis not present

## 2022-12-10 DIAGNOSIS — E119 Type 2 diabetes mellitus without complications: Secondary | ICD-10-CM | POA: Insufficient documentation

## 2022-12-10 DIAGNOSIS — F17210 Nicotine dependence, cigarettes, uncomplicated: Secondary | ICD-10-CM | POA: Diagnosis not present

## 2022-12-10 DIAGNOSIS — I10 Essential (primary) hypertension: Secondary | ICD-10-CM | POA: Insufficient documentation

## 2022-12-10 DIAGNOSIS — K641 Second degree hemorrhoids: Secondary | ICD-10-CM | POA: Insufficient documentation

## 2022-12-10 DIAGNOSIS — R195 Other fecal abnormalities: Secondary | ICD-10-CM | POA: Insufficient documentation

## 2022-12-10 DIAGNOSIS — Z1211 Encounter for screening for malignant neoplasm of colon: Secondary | ICD-10-CM | POA: Insufficient documentation

## 2022-12-10 DIAGNOSIS — Z800 Family history of malignant neoplasm of digestive organs: Secondary | ICD-10-CM | POA: Insufficient documentation

## 2022-12-10 HISTORY — DX: Other intervertebral disc degeneration, lumbosacral region without mention of lumbar back pain or lower extremity pain: M51.379

## 2022-12-10 HISTORY — DX: Hypokalemia: E87.6

## 2022-12-10 HISTORY — DX: Type 2 diabetes mellitus without complications: E11.9

## 2022-12-10 HISTORY — DX: Disorder of bone, unspecified: M89.9

## 2022-12-10 HISTORY — PX: COLONOSCOPY WITH PROPOFOL: SHX5780

## 2022-12-10 HISTORY — DX: Malignant neoplasm of head of pancreas: C25.0

## 2022-12-10 HISTORY — DX: Obstruction of bile duct: K83.1

## 2022-12-10 LAB — GLUCOSE, CAPILLARY
Glucose-Capillary: 59 mg/dL — ABNORMAL LOW (ref 70–99)
Glucose-Capillary: 84 mg/dL (ref 70–99)

## 2022-12-10 SURGERY — COLONOSCOPY WITH PROPOFOL
Anesthesia: General

## 2022-12-10 MED ORDER — PROPOFOL 10 MG/ML IV BOLUS
INTRAVENOUS | Status: DC | PRN
  Administered 2022-12-10: 80 mg via INTRAVENOUS
  Administered 2022-12-10: 80 ug/kg/min via INTRAVENOUS

## 2022-12-10 MED ORDER — PHENYLEPHRINE 80 MCG/ML (10ML) SYRINGE FOR IV PUSH (FOR BLOOD PRESSURE SUPPORT)
PREFILLED_SYRINGE | INTRAVENOUS | Status: DC | PRN
  Administered 2022-12-10 (×2): 120 ug via INTRAVENOUS

## 2022-12-10 MED ORDER — GLYCOPYRROLATE 0.2 MG/ML IJ SOLN
INTRAMUSCULAR | Status: DC | PRN
  Administered 2022-12-10: .2 mg via INTRAVENOUS

## 2022-12-10 MED ORDER — SEVOFLURANE IN SOLN
RESPIRATORY_TRACT | Status: AC
  Filled 2022-12-10: qty 250

## 2022-12-10 MED ORDER — DEXTROSE 50 % IV SOLN
INTRAVENOUS | Status: AC
  Filled 2022-12-10: qty 50

## 2022-12-10 MED ORDER — SODIUM CHLORIDE 0.9 % IV SOLN
INTRAVENOUS | Status: DC
Start: 2022-12-10 — End: 2022-12-10

## 2022-12-10 MED ORDER — DEXTROSE 50 % IV SOLN
INTRAVENOUS | Status: DC | PRN
  Administered 2022-12-10: 14 g via INTRAVENOUS

## 2022-12-10 NOTE — Transfer of Care (Signed)
Immediate Anesthesia Transfer of Care Note  Patient: Samuel Lynch  Procedure(s) Performed: COLONOSCOPY WITH PROPOFOL  Patient Location: PACU  Anesthesia Type:General  Level of Consciousness: awake, alert , and oriented  Airway & Oxygen Therapy: Patient Spontanous Breathing  Post-op Assessment: Report given to RN and Post -op Vital signs reviewed and stable  Post vital signs: Reviewed and stable  Last Vitals: Oriented x 4 now.   Pt denies pain    All PACU stable.   Pt laughing and talking coherantly.   FSBS 84 in PACU.   Vitals Value Taken Time  BP 95/80 12/10/22 1305  Temp    Pulse    Resp    SpO2    Vitals shown include unfiled device data.  Last Pain:  Vitals:   12/10/22 1158  TempSrc: Temporal  PainSc: 0-No pain         Complications: No notable events documented.

## 2022-12-10 NOTE — Op Note (Signed)
[facility] Gastroenterology Patient Name: Samuel Lynch Procedure Date: 12/10/2022 12:30 PM MRN: [id number] Account #: [id number] Date of Birth: 23-Feb-1955 Admit Type: Outpatient Age: 67 Room: [facility] ENDO ROOM 3 Gender: Male Note Status: Finalized Instrument Name: Prentice Docker [id number] Procedure:             Colonoscopy Indications:           Screening for colorectal malignant neoplasm,                         Incidental - Positive Cologuard test Providers:             Eather Colas MD, MD Referring MD:          Clydie Braun (Referring MD) Medicines:             Monitored Anesthesia Care Complications:         No immediate complications. Procedure:             Pre-Anesthesia Assessment:                        - Prior to the procedure, a History and Physical was                         performed, and patient medications and allergies were                         reviewed. The patient is competent. The risks and                         benefits of the procedure and the sedation options and                         risks were discussed with the patient. All questions                         were answered and informed consent was obtained.                         Patient identification and proposed procedure were                         verified by the physician, the nurse, the                         anesthesiologist, the anesthetist and the technician                         in the endoscopy suite. Mental Status Examination:                         alert and oriented. Airway Examination: normal                         oropharyngeal airway and neck mobility. Respiratory                         Examination: clear to auscultation. CV Examination:  normal. Prophylactic Antibiotics: The patient does not                         require prophylactic antibiotics. Prior                         Anticoagulants: The patient has taken no anticoagulant                          or antiplatelet agents. ASA Grade Assessment: III - A                         patient with severe systemic disease. After reviewing                         the risks and benefits, the patient was deemed in                         satisfactory condition to undergo the procedure. The                         anesthesia plan was to use monitored anesthesia care                         (MAC). Immediately prior to administration of                         medications, the patient was re-assessed for adequacy                         to receive sedatives. The heart rate, respiratory                         rate, oxygen saturations, blood pressure, adequacy of                         pulmonary ventilation, and response to care were                         monitored throughout the procedure. The physical                         status of the patient was re-assessed after the                         procedure.                        After obtaining informed consent, the colonoscope was                         passed under direct vision. Throughout the procedure,                         the patient's blood pressure, pulse, and oxygen                         saturations were monitored continuously. The  Colonoscope was introduced through the anus and                         advanced to the the cecum, identified by appendiceal                         orifice and ileocecal valve. The colonoscopy was                         performed without difficulty. The patient tolerated                         the procedure well. The quality of the bowel                         preparation was fair. The ileocecal valve, appendiceal                         orifice, and rectum were photographed. Findings:      The perianal and digital rectal examinations were normal.      Internal hemorrhoids were found during retroflexion. The hemorrhoids       were Grade II (internal  hemorrhoids that prolapse but reduce       spontaneously).      The exam was otherwise without abnormality on direct and retroflexion       views. Impression:            - Preparation of the colon was fair.                        - Internal hemorrhoids.                        - The examination was otherwise normal on direct and                         retroflexion views.                        - No specimens collected. Recommendation:        - Discharge patient to home.                        - Resume previous diet.                        - Continue present medications.                        - Repeat colonoscopy in 6-12 months because the bowel                         preparation was suboptimal.                        - Return to referring physician as previously                         scheduled. Procedure Code(s):     --- Professional ---  J1914, Colorectal cancer screening; colonoscopy on                         individual not meeting criteria for high risk Diagnosis Code(s):     --- Professional ---                        Z12.11, Encounter for screening for malignant neoplasm                         of colon                        K64.1, Second degree hemorrhoids CPT copyright 2022 American Medical Association. All rights reserved. The codes documented in this report are preliminary and upon coder review may  be revised to meet current compliance requirements. Eather Colas MD, MD 12/10/2022 1:05:58 PM Number of Addenda: 0 Note Initiated On: 12/10/2022 12:30 PM Scope Withdrawal Time: 0 hours 9 minutes 0 seconds  Total Procedure Duration: 0 hours 15 minutes 29 seconds  Estimated Blood Loss:  Estimated blood loss: none.      [facility]

## 2022-12-10 NOTE — H&P (Signed)
Outpatient short stay form Pre-procedure 12/10/2022  Samuel Bill, MD  Primary Physician: Mick Sell, MD  Reason for visit:  Positive cologuard  History of present illness:    67 y/o gentleman with history of hypertension and pancreatic cancer s/p whipple surgery. No blood thinners. Brother had colon cancer around 47.    Current Facility-Administered Medications:    0.9 %  sodium chloride infusion, , Intravenous, Continuous, Adina Puzzo, Rossie Muskrat, MD, Last Rate: 20 mL/hr at 12/10/22 1200, New Bag at 12/10/22 1200  Medications Prior to Admission  Medication Sig Dispense Refill Last Dose   amLODipine (NORVASC) 5 MG tablet Take 5 mg by mouth daily.   12/10/2022 at 0600   Continuous Glucose Receiver (DEXCOM G6 RECEIVER) DEVI by Does not apply route in the morning, at noon, in the evening, and at bedtime.      cyanocobalamin (VITAMIN B12) 1000 MCG tablet Take 1,000 mcg by mouth daily.   12/09/2022   insulin glargine (LANTUS) 100 UNIT/ML injection Inject 2 Units into the skin at bedtime.   12/09/2022   lipase/protease/amylase (CREON) 12000-38000 units CPEP capsule Take 24,000 Units by mouth 3 (three) times daily before meals.   12/08/2022   pantoprazole (PROTONIX) 40 MG tablet Take 40 mg by mouth daily.   12/08/2022   meclizine (ANTIVERT) 25 MG tablet Take 1 tablet (25 mg total) by mouth 3 (three) times daily as needed for dizziness. 30 tablet 0      No Known Allergies   Past Medical History:  Diagnosis Date   DDD (degenerative disc disease), lumbosacral    Diabetes mellitus without complication (HCC)    Hypertension    Hypokalemia    Malignant neoplasm of head of pancreas (HCC)    Obstructive jaundice    Skull lesion     Review of systems:  Otherwise negative.    Physical Exam  Gen: Alert, oriented. Appears stated age.  HEENT: PERRLA. Lungs: No respiratory distress CV: RRR Abd: soft, benign, no masses Ext: No edema    Planned procedures: Proceed with  colonoscopy. The patient understands the nature of the planned procedure, indications, risks, alternatives and potential complications including but not limited to bleeding, infection, perforation, damage to internal organs and possible oversedation/side effects from anesthesia. The patient agrees and gives consent to proceed.  Please refer to procedure notes for findings, recommendations and patient disposition/instructions.     Samuel Bill, MD [facility]

## 2022-12-10 NOTE — Anesthesia Preprocedure Evaluation (Signed)
Anesthesia Evaluation  Patient identified by MRN, date of birth, ID band Patient awake    Reviewed: Allergy & Precautions, NPO status , Patient's Chart, lab work & pertinent test results  Airway Mallampati: III  TM Distance: >3 FB Neck ROM: full    Dental  (+) Chipped, Dental Advidsory Given   Pulmonary neg pulmonary ROS, Current Smoker   Pulmonary exam normal        Cardiovascular hypertension, On Medications negative cardio ROS Normal cardiovascular exam     Neuro/Psych negative neurological ROS  negative psych ROS   GI/Hepatic negative GI ROS, Neg liver ROS,,,  Endo/Other  diabetes    Renal/GU negative Renal ROS  negative genitourinary   Musculoskeletal   Abdominal   Peds  Hematology negative hematology ROS (+)   Anesthesia Other Findings Past Medical History: No date: DDD (degenerative disc disease), lumbosacral No date: Diabetes mellitus without complication (HCC) No date: Hypertension No date: Hypokalemia No date: Malignant neoplasm of head of pancreas (HCC) No date: Obstructive jaundice No date: Skull lesion  Past Surgical History: No date: COLONOSCOPY No date: CRANIOTOMY FOR TUMOR No date: ESOPHAGOGASTRODUODENOSCOPY  BMI    Body Mass Index: 20.49 kg/m      Reproductive/Obstetrics negative OB ROS                             Anesthesia Physical Anesthesia Plan  ASA: 2  Anesthesia Plan: General   Post-op Pain Management: Minimal or no pain anticipated   Induction: Intravenous  PONV Risk Score and Plan: 3 and Propofol infusion, TIVA and Ondansetron  Airway Management Planned: Nasal Cannula  Additional Equipment: None  Intra-op Plan:   Post-operative Plan:   Informed Consent: I have reviewed the patients History and Physical, chart, labs and discussed the procedure including the risks, benefits and alternatives for the proposed anesthesia with the patient or  authorized representative who has indicated his/her understanding and acceptance.     Dental advisory given  Plan Discussed with: CRNA and Surgeon  Anesthesia Plan Comments: (Discussed risks of anesthesia with patient, including possibility of difficulty with spontaneous ventilation under anesthesia necessitating airway intervention, PONV, and rare risks such as cardiac or respiratory or neurological events, and allergic reactions. Discussed the role of CRNA in patient's perioperative care. Patient understands.)       Anesthesia Quick Evaluation

## 2022-12-10 NOTE — Interval H&P Note (Signed)
History and Physical Interval Note:  12/10/2022 12:37 PM  Samuel Lynch  has presented today for surgery, with the diagnosis of + Cologuard.  The various methods of treatment have been discussed with the patient and family. After consideration of risks, benefits and other options for treatment, the patient has consented to  Procedure(s): COLONOSCOPY WITH PROPOFOL (N/A) as a surgical intervention.  The patient's history has been reviewed, patient examined, no change in status, stable for surgery.  I have reviewed the patient's chart and labs.  Questions were answered to the patient's satisfaction.     Regis Bill  Ok to proceed with colonoscopy

## 2022-12-11 ENCOUNTER — Encounter: Payer: Self-pay | Admitting: Gastroenterology

## 2022-12-11 NOTE — Anesthesia Postprocedure Evaluation (Signed)
Anesthesia Post Note  Patient: Samuel Lynch  Procedure(s) Performed: COLONOSCOPY WITH PROPOFOL  Patient location during evaluation: Endoscopy Anesthesia Type: General Level of consciousness: awake and alert Pain management: pain level controlled Vital Signs Assessment: post-procedure vital signs reviewed and stable Respiratory status: spontaneous breathing, nonlabored ventilation, respiratory function stable and patient connected to nasal cannula oxygen Cardiovascular status: blood pressure returned to baseline and stable Postop Assessment: no apparent nausea or vomiting Anesthetic complications: no   No notable events documented.   Last Vitals:  Vitals:   12/10/22 1315 12/10/22 1325  BP: 114/86 134/80  Pulse: 84 73  Resp: 16   Temp:    SpO2: 100% 100%    Last Pain:  Vitals:   12/11/22 0707  TempSrc:   PainSc: 0-No pain                 Stephanie Coup

## 2023-01-21 ENCOUNTER — Encounter: Payer: Self-pay | Admitting: Ophthalmology

## 2023-01-21 NOTE — Anesthesia Preprocedure Evaluation (Addendum)
 Anesthesia Evaluation  Patient identified by MRN, date of birth, ID band Patient awake    Reviewed: Allergy & Precautions, NPO status , Patient's Chart, lab work & pertinent test results  Airway Mallampati: III  TM Distance: >3 FB Neck ROM: full    Dental  (+) Chipped, Dental Advidsory Given   Pulmonary neg pulmonary ROS, Current Smoker and Patient abstained from smoking.   Pulmonary exam normal        Cardiovascular hypertension, On Medications negative cardio ROS Normal cardiovascular exam     Neuro/Psych negative neurological ROS  negative psych ROS   GI/Hepatic negative GI ROS, Neg liver ROS,,,  Endo/Other  diabetes    Renal/GU negative Renal ROS  negative genitourinary   Musculoskeletal   Abdominal   Peds  Hematology negative hematology ROS (+)   Anesthesia Other Findings Hypertension  Skull lesion Hypokalemia  IDDM Type I diabetes mellitus Malignant neoplasm of head of pancreas Obstructive jaundice DDD (degenerative disc disease), lumbosacral  Vertigo GERD Malignant neoplasm head of pancreas      Reproductive/Obstetrics negative OB ROS                              Anesthesia Physical Anesthesia Plan  ASA: 2  Anesthesia Plan: MAC   Post-op Pain Management: Minimal or no pain anticipated   Induction: Intravenous  PONV Risk Score and Plan: 1 and Midazolam  and TIVA  Airway Management Planned: Natural Airway and Nasal Cannula  Additional Equipment: None  Intra-op Plan:   Post-operative Plan:   Informed Consent: I have reviewed the patients History and Physical, chart, labs and discussed the procedure including the risks, benefits and alternatives for the proposed anesthesia with the patient or authorized representative who has indicated his/her understanding and acceptance.     Dental Advisory Given  Plan Discussed with: Anesthesiologist, CRNA and  Surgeon  Anesthesia Plan Comments: (Patient consented for risks of anesthesia including but not limited to:  - adverse reactions to medications - damage to eyes, teeth, lips or other oral mucosa - nerve damage due to positioning  - sore throat or hoarseness - Damage to heart, brain, nerves, lungs, other parts of body or loss of life  Patient voiced understanding and assent.)        Anesthesia Quick Evaluation

## 2023-01-22 ENCOUNTER — Encounter: Payer: Self-pay | Admitting: Ophthalmology

## 2023-01-24 NOTE — Discharge Instructions (Signed)

## 2023-01-28 ENCOUNTER — Other Ambulatory Visit: Payer: Self-pay

## 2023-01-28 ENCOUNTER — Ambulatory Visit: Payer: 59 | Admitting: Anesthesiology

## 2023-01-28 ENCOUNTER — Encounter: Payer: Self-pay | Admitting: Ophthalmology

## 2023-01-28 ENCOUNTER — Ambulatory Visit
Admission: RE | Admit: 2023-01-28 | Discharge: 2023-01-28 | Disposition: A | Discharge status: Home / Self Care | Payer: 59 | Attending: Ophthalmology | Admitting: Ophthalmology

## 2023-01-28 ENCOUNTER — Encounter
Admission: RE | Disposition: A | Discharge status: Home / Self Care | Payer: Self-pay | Source: Home / Self Care | Attending: Ophthalmology

## 2023-01-28 DIAGNOSIS — E1136 Type 2 diabetes mellitus with diabetic cataract: Secondary | ICD-10-CM | POA: Diagnosis present

## 2023-01-28 DIAGNOSIS — Z794 Long term (current) use of insulin: Secondary | ICD-10-CM | POA: Diagnosis not present

## 2023-01-28 DIAGNOSIS — H2512 Age-related nuclear cataract, left eye: Secondary | ICD-10-CM | POA: Diagnosis not present

## 2023-01-28 DIAGNOSIS — H25012 Cortical age-related cataract, left eye: Secondary | ICD-10-CM | POA: Diagnosis not present

## 2023-01-28 HISTORY — DX: Polyneuropathy, unspecified: G62.9

## 2023-01-28 HISTORY — DX: Gastro-esophageal reflux disease without esophagitis: K21.9

## 2023-01-28 HISTORY — DX: Type 1 diabetes mellitus without complications: E10.9

## 2023-01-28 HISTORY — PX: CATARACT EXTRACTION W/PHACO: SHX586

## 2023-01-28 HISTORY — DX: Benign neoplasm of meninges, unspecified: D32.9

## 2023-01-28 LAB — GLUCOSE, CAPILLARY
Glucose-Capillary: 176 mg/dL — ABNORMAL HIGH (ref 70–99)
Glucose-Capillary: 68 mg/dL — ABNORMAL LOW (ref 70–99)
Glucose-Capillary: 106 mg/dL — ABNORMAL HIGH (ref 70–99)

## 2023-01-28 SURGERY — PHACOEMULSIFICATION, CATARACT, WITH IOL INSERTION
Anesthesia: Monitor Anesthesia Care | Site: Eye | Laterality: Left

## 2023-01-28 MED ORDER — SIGHTPATH DOSE#1 BSS IO SOLN
INTRAOCULAR | Status: DC | PRN
  Administered 2023-01-28: 15 mL

## 2023-01-28 MED ORDER — DEXTROSE 50 % IV SOLN
INTRAVENOUS | Status: AC
  Filled 2023-01-28: qty 50

## 2023-01-28 MED ORDER — SIGHTPATH DOSE#1 NA HYALUR & NA CHOND-NA HYALUR IO KIT
PACK | INTRAOCULAR | Status: DC | PRN
  Administered 2023-01-28: 1 via OPHTHALMIC

## 2023-01-28 MED ORDER — TETRACAINE HCL 0.5 % OP SOLN
OPHTHALMIC | Status: AC
Start: 2023-01-28 — End: ?
  Filled 2023-01-28: qty 4

## 2023-01-28 MED ORDER — MIDAZOLAM HCL 2 MG/2ML IJ SOLN
INTRAMUSCULAR | Status: DC | PRN
  Administered 2023-01-28 (×2): 1 mg via INTRAVENOUS

## 2023-01-28 MED ORDER — LIDOCAINE HCL (PF) 2 % IJ SOLN
INTRAOCULAR | Status: DC | PRN
  Administered 2023-01-28: 1 mL via INTRAOCULAR

## 2023-01-28 MED ORDER — DEXTROSE 50 % IV SOLN
25.0000 mL | Freq: Once | INTRAVENOUS | Status: AC
  Administered 2023-01-28: 25 mL via INTRAVENOUS

## 2023-01-28 MED ORDER — MOXIFLOXACIN HCL 0.5 % OP SOLN
OPHTHALMIC | Status: DC | PRN
  Administered 2023-01-28: .2 mL via OPHTHALMIC

## 2023-01-28 MED ORDER — TETRACAINE HCL 0.5 % OP SOLN
1.0000 [drp] | OPHTHALMIC | Status: DC | PRN
  Administered 2023-01-28 (×3): 1 [drp] via OPHTHALMIC

## 2023-01-28 MED ORDER — FENTANYL CITRATE (PF) 100 MCG/2ML IJ SOLN
INTRAMUSCULAR | Status: AC
  Filled 2023-01-28: qty 2

## 2023-01-28 MED ORDER — FENTANYL CITRATE (PF) 100 MCG/2ML IJ SOLN
INTRAMUSCULAR | Status: DC | PRN
  Administered 2023-01-28: 25 ug via INTRAVENOUS

## 2023-01-28 MED ORDER — MIDAZOLAM HCL 2 MG/2ML IJ SOLN
INTRAMUSCULAR | Status: AC
  Filled 2023-01-28: qty 2

## 2023-01-28 MED ORDER — ARMC OPHTHALMIC DILATING DROPS
OPHTHALMIC | Status: AC
  Filled 2023-01-28: qty 0.5

## 2023-01-28 MED ORDER — ARMC OPHTHALMIC DILATING DROPS
1.0000 | OPHTHALMIC | Status: DC | PRN
  Administered 2023-01-28 (×3): 1 via OPHTHALMIC

## 2023-01-28 MED ORDER — SIGHTPATH DOSE#1 BSS IO SOLN
INTRAOCULAR | Status: DC | PRN
  Administered 2023-01-28: 108 mL via OPHTHALMIC

## 2023-01-28 SURGICAL SUPPLY — 16 items
CANNULA ANT/CHMB 27G (MISCELLANEOUS) IMPLANT
CANNULA ANT/CHMB 27GA (MISCELLANEOUS)
CATARACT SUITE SIGHTPATH (MISCELLANEOUS) ×1
DISSECTOR HYDRO NUCLEUS 50X22 (MISCELLANEOUS) ×1 IMPLANT
FEE CATARACT SUITE SIGHTPATH (MISCELLANEOUS) ×1 IMPLANT
GLOVE PI ULTRA LF STRL 7.5 (GLOVE) ×1 IMPLANT
GLOVE SURG POLYISOPRENE 8.5 (GLOVE) ×1
GLOVE SURG SYN 8.5 PF PI BL (GLOVE) ×1 IMPLANT
LENS IOL TECNIS EYHANCE 20.5 (Intraocular Lens) IMPLANT
NDL FILTER BLUNT 18X1 1/2 (NEEDLE) ×1 IMPLANT
NEEDLE FILTER BLUNT 18X1 1/2 (NEEDLE) ×1
PACK VIT ANT 23G (MISCELLANEOUS) IMPLANT
RING MALYGIN (MISCELLANEOUS) IMPLANT
SUT ETHILON 10-0 CS-B-6CS-B-6 (SUTURE)
SUTURE EHLN 10-0 CS-B-6CS-B-6 (SUTURE) IMPLANT
SYR 3ML LL SCALE MARK (SYRINGE) ×1 IMPLANT

## 2023-01-28 NOTE — Transfer of Care (Signed)
 Immediate Anesthesia Transfer of Care Note  Patient: Samuel Lynch  Procedure(s) Performed: CATARACT EXTRACTION PHACO AND INTRAOCULAR LENS PLACEMENT (IOC) LEFT DIABETIC (Left: Eye)  Patient Location: PACU  Anesthesia Type: MAC  Level of Consciousness: awake, alert  and patient cooperative  Airway and Oxygen Therapy: Patient Spontanous Breathing and Patient connected to supplemental oxygen  Post-op Assessment: Post-op Vital signs reviewed, Patient's Cardiovascular Status Stable, Respiratory Function Stable, Patent Airway and No signs of Nausea or vomiting  Post-op Vital Signs: Reviewed and stable  Complications: No notable events documented.

## 2023-01-28 NOTE — Op Note (Signed)
 OPERATIVE NOTE  BIJON MINEER [id number] 01/28/2023   PREOPERATIVE DIAGNOSIS:  Nuclear sclerotic cataract left eye.  H25.12   POSTOPERATIVE DIAGNOSIS:    Nuclear sclerotic cataract left eye.     PROCEDURE:  Phacoemusification with posterior chamber intraocular lens placement of the left eye   LENS:   Implant Name Type Inv. Item Serial No. Manufacturer Lot No. LRB No. Used Action  LENS IOL TECNIS EYHANCE 20.5 - [id number] Intraocular Lens LENS IOL TECNIS EYHANCE 20.5 [id number] [employer]  Left 1 Implanted      Procedure(s) with comments: CATARACT EXTRACTION PHACO AND INTRAOCULAR LENS PLACEMENT (IOC) LEFT DIABETIC (Left) - 7.55 0:45.2  SURGEON:  Adine Novak, MD, MPH   ANESTHESIA:  Topical with tetracaine  drops augmented with 1% preservative-free intracameral lidocaine .  ESTIMATED BLOOD LOSS: <1 mL   COMPLICATIONS:  None.   DESCRIPTION OF PROCEDURE:  The patient was identified in the holding room and transported to the operating room and placed in the supine position under the operating microscope.  The left eye was identified as the operative eye and it was prepped and draped in the usual sterile ophthalmic fashion.   A 1.0 millimeter clear-corneal paracentesis was made at the 5:00 position. 0.5 ml of preservative-free 1% lidocaine  with epinephrine  was injected into the anterior chamber.  The anterior chamber was filled with viscoelastic.  A 2.4 millimeter keratome was used to make a near-clear corneal incision at the 2:00 position.  A curvilinear capsulorrhexis was made with a cystotome and capsulorrhexis forceps.  Balanced salt  solution was used to hydrodissect and hydrodelineate the nucleus.   Phacoemulsification was then used in stop and chop fashion to remove the lens nucleus and epinucleus.  The remaining cortex was then removed using the irrigation and aspiration handpiece. Viscoelastic was then placed into the capsular bag to distend it for lens placement.  A lens was then  injected into the capsular bag.  The remaining viscoelastic was aspirated.   Wounds were hydrated with balanced salt  solution.  The anterior chamber was inflated to a physiologic pressure with balanced salt  solution.  Intracameral vigamox  0.1 mL undiltued was injected into the eye and a drop placed onto the ocular surface.  No wound leaks were noted.  The patient was taken to the recovery room in stable condition without complications of anesthesia or surgery  Adine Novak 01/28/2023, 7:53 AM

## 2023-01-28 NOTE — Anesthesia Postprocedure Evaluation (Signed)
 Anesthesia Post Note  Patient: MELVERN RAMONE  Procedure(s) Performed: CATARACT EXTRACTION PHACO AND INTRAOCULAR LENS PLACEMENT (IOC) LEFT DIABETIC (Left: Eye)  Patient location during evaluation: PACU Anesthesia Type: MAC Level of consciousness: awake and alert Pain management: pain level controlled Vital Signs Assessment: post-procedure vital signs reviewed and stable Respiratory status: spontaneous breathing, nonlabored ventilation, respiratory function stable and patient connected to nasal cannula oxygen Cardiovascular status: blood pressure returned to baseline and stable Postop Assessment: no apparent nausea or vomiting Anesthetic complications: no  No notable events documented.   Last Vitals:  Vitals:   01/28/23 0801 01/28/23 0802  BP:    Pulse: (!) 50 (!) 48  Resp: 16 17  Temp:    SpO2: 98% 98%    Last Pain:  Vitals:   01/28/23 0745  TempSrc:   PainSc: 0-No pain                 Debby Mines

## 2023-01-28 NOTE — Addendum Note (Signed)
 Addendum  created 01/28/23 0947 by Stephanie Coup, MD   Attestation recorded in Intraprocedure, Intraprocedure Attestations filed

## 2023-01-28 NOTE — H&P (Signed)
 [facility]   Primary Care Physician:  Epifanio Alm SQUIBB, MD Ophthalmologist: Dr. Adine Novak  Pre-Procedure History & Physical: HPI:  Samuel Lynch is a 68 y.o. male here for cataract surgery.   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 Authorizing Provider  amLODipine (NORVASC) 5 MG tablet Take 5 mg by mouth daily.   Yes [provider]  atorvastatin (LIPITOR) 10 MG tablet Take 10 mg by mouth daily.   Yes [provider]  Continuous Glucose Receiver (DEXCOM G6 RECEIVER) DEVI by Does not apply route in the morning, at noon, in the evening, and at bedtime.   Yes [provider]  cyanocobalamin (VITAMIN B12) 1000 MCG tablet Take 1,000 mcg by mouth daily.   Yes [provider]  insulin glargine (LANTUS) 100 UNIT/ML injection Inject 2 Units into the skin at bedtime.   Yes [provider]  insulin lispro (HUMALOG) 100 UNIT/ML injection Inject 2 Units into the skin 3 (three) times daily before meals.   Yes [provider]  lipase/protease/amylase (CREON) 12000-38000 units CPEP capsule Take 24,000 Units by mouth 3 (three) times daily before meals.   Yes [provider]   pantoprazole (PROTONIX) 40 MG tablet Take 40 mg by mouth daily.   Yes [provider]    Allergies as of 01/17/2023   (No Known Allergies)    History reviewed. No pertinent family history.  Social History   Socioeconomic History   Marital status: Married    Spouse name: Not on file   Number of children: Not on file   Years of education: Not on file   Highest education level: Not on file  Occupational History   Not on file  Tobacco Use   Smoking status: Every Day    Current packs/day: 0.50    Average packs/day: 0.5 packs/day for 52.0 years (26.0 ttl pk-yrs)    Types: Cigarettes    Start date: 57   Smokeless tobacco: Never  Vaping Use   Vaping status: Never Used  Substance and Sexual Activity   Alcohol use: Yes    Comment: occ   Drug use: Never   Sexual activity: Not on file  Other Topics Concern   Not on file  Social History Narrative   Not on file   Social Drivers of Health   Financial Resource Strain: Low Risk  (12/27/2022)   Received from [facility]   Overall Financial Resource Strain (CARDIA)    Difficulty of Paying Living Expenses: Not hard at all  Food Insecurity: No Food Insecurity (12/27/2022)   Received from [facility]   Hunger  Vital Sign    Worried About Programme Researcher, Broadcasting/film/video in the Last Year: Never true    Ran Out of Food in the Last Year: Never true  Transportation Needs: No Transportation Needs (12/27/2022)   Received from [facility]    In the past 12 months, has lack of transportation kept you from medical appointments or from getting medications?: No    Lack of Transportation (Non-Medical): No  Physical Activity: Not on file  Stress: Not on file  Social Connections: Not on file  Intimate Partner Violence: Not on file    Review of Systems: See HPI, otherwise negative ROS  Physical Exam: BP (!) 145/89   Temp 97.6 F (36.4 C) (Temporal)   Resp 17    Ht 5' 7.01 (1.702 m)   Wt 62.6 kg   SpO2 99%   BMI 21.62 kg/m  General:   Alert, cooperative in NAD Head:  Normocephalic and atraumatic. Respiratory:  Normal work of breathing. Cardiovascular:  RRR  Impression/Plan: Samuel Lynch is here for cataract surgery.  Risks, benefits, limitations, and alternatives regarding cataract surgery have been reviewed with the patient.  Questions have been answered.  All parties agreeable.   Adine Novak, MD  01/28/2023, 7:19 AM

## 2023-01-29 ENCOUNTER — Encounter: Payer: Self-pay | Admitting: Ophthalmology

## 2023-02-07 NOTE — Discharge Instructions (Signed)

## 2023-02-11 ENCOUNTER — Ambulatory Visit: Payer: 59 | Admitting: Anesthesiology

## 2023-02-11 ENCOUNTER — Ambulatory Visit
Admission: RE | Admit: 2023-02-11 | Discharge: 2023-02-11 | Disposition: A | Discharge status: Home / Self Care | Payer: 59 | Attending: Ophthalmology | Admitting: Ophthalmology

## 2023-02-11 ENCOUNTER — Other Ambulatory Visit: Payer: Self-pay

## 2023-02-11 ENCOUNTER — Encounter
Admission: RE | Disposition: A | Discharge status: Home / Self Care | Payer: Self-pay | Source: Home / Self Care | Attending: Ophthalmology

## 2023-02-11 ENCOUNTER — Encounter: Payer: Self-pay | Admitting: Ophthalmology

## 2023-02-11 DIAGNOSIS — H2511 Age-related nuclear cataract, right eye: Secondary | ICD-10-CM | POA: Insufficient documentation

## 2023-02-11 DIAGNOSIS — F17210 Nicotine dependence, cigarettes, uncomplicated: Secondary | ICD-10-CM | POA: Diagnosis not present

## 2023-02-11 DIAGNOSIS — Z794 Long term (current) use of insulin: Secondary | ICD-10-CM | POA: Diagnosis not present

## 2023-02-11 DIAGNOSIS — E1036 Type 1 diabetes mellitus with diabetic cataract: Secondary | ICD-10-CM | POA: Insufficient documentation

## 2023-02-11 DIAGNOSIS — E1040 Type 1 diabetes mellitus with diabetic neuropathy, unspecified: Secondary | ICD-10-CM | POA: Diagnosis not present

## 2023-02-11 HISTORY — PX: CATARACT EXTRACTION W/PHACO: SHX586

## 2023-02-11 LAB — GLUCOSE, CAPILLARY: Glucose-Capillary: 77 mg/dL (ref 70–99)

## 2023-02-11 SURGERY — PHACOEMULSIFICATION, CATARACT, WITH IOL INSERTION
Anesthesia: Monitor Anesthesia Care | Site: Eye | Laterality: Right

## 2023-02-11 MED ORDER — MOXIFLOXACIN HCL 0.5 % OP SOLN
OPHTHALMIC | Status: DC | PRN
  Administered 2023-02-11: .2 mL via OPHTHALMIC

## 2023-02-11 MED ORDER — SIGHTPATH DOSE#1 BSS IO SOLN
INTRAOCULAR | Status: DC | PRN
  Administered 2023-02-11: 15 mL via INTRAOCULAR

## 2023-02-11 MED ORDER — TETRACAINE HCL 0.5 % OP SOLN
OPHTHALMIC | Status: AC
  Filled 2023-02-11: qty 4

## 2023-02-11 MED ORDER — TETRACAINE HCL 0.5 % OP SOLN
1.0000 [drp] | OPHTHALMIC | Status: DC | PRN
  Administered 2023-02-11 (×3): 1 [drp] via OPHTHALMIC

## 2023-02-11 MED ORDER — ARMC OPHTHALMIC DILATING DROPS
OPHTHALMIC | Status: AC
  Filled 2023-02-11: qty 0.5

## 2023-02-11 MED ORDER — SIGHTPATH DOSE#1 BSS IO SOLN
INTRAOCULAR | Status: DC | PRN
  Administered 2023-02-11: 100 mL via OPHTHALMIC

## 2023-02-11 MED ORDER — LIDOCAINE HCL (PF) 2 % IJ SOLN
INTRAOCULAR | Status: DC | PRN
  Administered 2023-02-11: 4 mL via INTRAOCULAR

## 2023-02-11 MED ORDER — FENTANYL CITRATE (PF) 100 MCG/2ML IJ SOLN
INTRAMUSCULAR | Status: DC | PRN
  Administered 2023-02-11: 50 ug via INTRAVENOUS

## 2023-02-11 MED ORDER — SIGHTPATH DOSE#1 NA HYALUR & NA CHOND-NA HYALUR IO KIT
PACK | INTRAOCULAR | Status: DC | PRN
  Administered 2023-02-11: 1 via OPHTHALMIC

## 2023-02-11 MED ORDER — MIDAZOLAM HCL 2 MG/2ML IJ SOLN
INTRAMUSCULAR | Status: DC | PRN
  Administered 2023-02-11: 2 mg via INTRAVENOUS

## 2023-02-11 MED ORDER — FENTANYL CITRATE (PF) 100 MCG/2ML IJ SOLN
INTRAMUSCULAR | Status: AC
  Filled 2023-02-11: qty 2

## 2023-02-11 MED ORDER — MIDAZOLAM HCL 2 MG/2ML IJ SOLN
INTRAMUSCULAR | Status: AC
Start: 2023-02-11 — End: ?
  Filled 2023-02-11: qty 2

## 2023-02-11 MED ORDER — ARMC OPHTHALMIC DILATING DROPS
1.0000 | OPHTHALMIC | Status: DC | PRN
Start: 2023-02-11 — End: 2023-02-11
  Administered 2023-02-11 (×3): 1 via OPHTHALMIC

## 2023-02-11 MED ORDER — ONDANSETRON HCL 4 MG/2ML IJ SOLN: 4.0000 mg | Freq: Once | INTRAMUSCULAR | Status: DC | PRN

## 2023-02-11 SURGICAL SUPPLY — 12 items
CANNULA ANT/CHMB 27G (MISCELLANEOUS) IMPLANT
CANNULA ANT/CHMB 27GA (MISCELLANEOUS)
CATARACT SUITE SIGHTPATH (MISCELLANEOUS) ×1
DISSECTOR HYDRO NUCLEUS 50X22 (MISCELLANEOUS) ×1 IMPLANT
FEE CATARACT SUITE SIGHTPATH (MISCELLANEOUS) ×1 IMPLANT
GLOVE PI ULTRA LF STRL 7.5 (GLOVE) ×1 IMPLANT
GLOVE SURG POLYISOPRENE 8.5 (GLOVE) ×1
GLOVE SURG SYN 8.5 PF PI BL (GLOVE) ×1 IMPLANT
LENS IOL TECNIS EYHANCE 21.5 (Intraocular Lens) IMPLANT
NDL FILTER BLUNT 18X1 1/2 (NEEDLE) ×1 IMPLANT
NEEDLE FILTER BLUNT 18X1 1/2 (NEEDLE) ×1
SYR 3ML LL SCALE MARK (SYRINGE) ×1 IMPLANT

## 2023-02-11 NOTE — Anesthesia Postprocedure Evaluation (Signed)
Anesthesia Post Note  Patient: Samuel Lynch  Procedure(s) Performed: CATARACT EXTRACTION PHACO AND INTRAOCULAR LENS PLACEMENT (IOC) RIGHT DIABETIC 5.80 00:40.0 (Right: Eye)  Patient location during evaluation: PACU Anesthesia Type: MAC Level of consciousness: awake and alert Pain management: pain level controlled Vital Signs Assessment: post-procedure vital signs reviewed and stable Respiratory status: spontaneous breathing, nonlabored ventilation, respiratory function stable and patient connected to nasal cannula oxygen Cardiovascular status: stable and blood pressure returned to baseline Postop Assessment: no apparent nausea or vomiting Anesthetic complications: no   No notable events documented.   Last Vitals:  Vitals:   02/11/23 0935 02/11/23 0941  BP: 104/71 136/75  Pulse: (!) 47 (!) 46  Resp: (!) 9 13  Temp: 36.5 C 36.5 C  SpO2: 99% 99%    Last Pain:  Vitals:   02/11/23 0941  TempSrc:   PainSc: 0-No pain                 Corinda Gubler

## 2023-02-11 NOTE — Transfer of Care (Signed)
Immediate Anesthesia Transfer of Care Note  Patient: Samuel Lynch  Procedure(s) Performed: CATARACT EXTRACTION PHACO AND INTRAOCULAR LENS PLACEMENT (IOC) RIGHT DIABETIC 5.80 00:40.0 (Right: Eye)  Patient Location: PACU  Anesthesia Type: MAC  Level of Consciousness: awake, alert  and patient cooperative  Airway and Oxygen Therapy: Patient Spontanous Breathing and Patient connected to supplemental oxygen  Post-op Assessment: Post-op Vital signs reviewed, Patient's Cardiovascular Status Stable, Respiratory Function Stable, Patent Airway and No signs of Nausea or vomiting  Post-op Vital Signs: Reviewed and stable  Complications: No notable events documented.

## 2023-02-11 NOTE — Op Note (Signed)
OPERATIVE NOTE  Samuel Lynch [id number] 02/11/2023   PREOPERATIVE DIAGNOSIS:  Nuclear sclerotic cataract right eye.  H25.11   POSTOPERATIVE DIAGNOSIS:    Nuclear sclerotic cataract right eye.     PROCEDURE:  Phacoemusification with posterior chamber intraocular lens placement of the right eye   LENS:   Implant Name Type Inv. Item Serial No. Manufacturer Lot No. LRB No. Used Action  LENS IOL TECNIS EYHANCE 21.5 - [id number] Intraocular Lens LENS IOL TECNIS EYHANCE 21.5 [id number] [employer]  Right 1 Implanted       Procedure(s): CATARACT EXTRACTION PHACO AND INTRAOCULAR LENS PLACEMENT (IOC) RIGHT DIABETIC 5.80 00:40.0 (Right)  SURGEON:  Willey Blade, MD, MPH  ANESTHESIOLOGIST: Anesthesiologist: Corinda Gubler, MD CRNA: Barbette Hair, CRNA   ANESTHESIA:  Topical with tetracaine drops augmented with 1% preservative-free intracameral lidocaine.  ESTIMATED BLOOD LOSS: less than 1 mL.   COMPLICATIONS:  None.   DESCRIPTION OF PROCEDURE:  The patient was identified in the holding room and transported to the operating room and placed in the supine position under the operating microscope.  The right eye was identified as the operative eye and it was prepped and draped in the usual sterile ophthalmic fashion.   A 1.0 millimeter clear-corneal paracentesis was made at the 10:30 position. 0.5 ml of preservative-free 1% lidocaine with epinephrine was injected into the anterior chamber.  The anterior chamber was filled with viscoelastic.  A 2.4 millimeter keratome was used to make a near-clear corneal incision at the 8:00 position.  A curvilinear capsulorrhexis was made with a cystotome and capsulorrhexis forceps.  Balanced salt solution was used to hydrodissect and hydrodelineate the nucleus.   Phacoemulsification was then used in stop and chop fashion to remove the lens nucleus and epinucleus.  The remaining cortex was then removed using the irrigation and aspiration handpiece. Viscoelastic was  then placed into the capsular bag to distend it for lens placement.  A lens was then injected into the capsular bag.  The remaining viscoelastic was aspirated.   Wounds were hydrated with balanced salt solution.  The anterior chamber was inflated to a physiologic pressure with balanced salt solution.   Intracameral vigamox 0.1 mL undiluted was injected into the eye and a drop placed onto the ocular surface.  No wound leaks were noted.  The patient was taken to the recovery room in stable condition without complications of anesthesia or surgery  Willey Blade 02/11/2023, 9:33 AM

## 2023-02-11 NOTE — Anesthesia Preprocedure Evaluation (Signed)
Anesthesia Evaluation  Patient identified by MRN, date of birth, ID band Patient awake    Reviewed: Allergy & Precautions, NPO status , Patient's Chart, lab work & pertinent test results  History of Anesthesia Complications Negative for: history of anesthetic complications  Airway Mallampati: II  TM Distance: >3 FB Neck ROM: full    Dental  (+) Dental Advidsory Given, Teeth Intact   Pulmonary neg sleep apnea, neg COPD, Current Smoker and Patient abstained from smoking.   Pulmonary exam normal breath sounds clear to auscultation       Cardiovascular Exercise Tolerance: Good METShypertension, On Medications (-) CAD and (-) Past MI Normal cardiovascular exam(-) dysrhythmias  Rhythm:Regular Rate:Normal - Systolic murmurs    Neuro/Psych negative neurological ROS  negative psych ROS   GI/Hepatic Neg liver ROS,GERD  Medicated and Controlled,,  Endo/Other  diabetes, Insulin Dependent    Renal/GU negative Renal ROS  negative genitourinary   Musculoskeletal   Abdominal   Peds  Hematology negative hematology ROS (+)   Anesthesia Other Findings Hypertension  Skull lesion Hypokalemia  IDDM Type I diabetes mellitus Malignant neoplasm of head of pancreas Obstructive jaundice DDD (degenerative disc disease), lumbosacral  Vertigo GERD Malignant neoplasm head of pancreas      Reproductive/Obstetrics negative OB ROS                             Anesthesia Physical Anesthesia Plan  ASA: 3  Anesthesia Plan: MAC   Post-op Pain Management: Minimal or no pain anticipated and Fentanyl IV   Induction: Intravenous  PONV Risk Score and Plan: 1 and Midazolam and TIVA  Airway Management Planned: Natural Airway and Nasal Cannula  Additional Equipment: None  Intra-op Plan:   Post-operative Plan:   Informed Consent: I have reviewed the patients History and Physical, chart, labs and discussed the  procedure including the risks, benefits and alternatives for the proposed anesthesia with the patient or authorized representative who has indicated his/her understanding and acceptance.     Dental Advisory Given  Plan Discussed with: Anesthesiologist, CRNA and Surgeon  Anesthesia Plan Comments: (Explained risks of anesthesia, including PONV, and rare emergencies such as cardiac events, respiratory problems, and allergic reactions, requiring invasive intervention. Discussed the role of CRNA in patient's perioperative care. Patient understands. )       Anesthesia Quick Evaluation

## 2023-02-11 NOTE — H&P (Signed)
[facility]   Primary Care Physician:  Mick Sell, MD Ophthalmologist: Dr. Willey Blade  Pre-Procedure History & Physical: HPI:  Samuel Lynch is a 68 y.o. male here for cataract surgery.   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   Obstructive jaundice    Skull lesion    Type 1 diabetes mellitus [facility])     Past Surgical History:  Procedure Laterality Date   CATARACT EXTRACTION W/PHACO Left 01/28/2023   Procedure: CATARACT EXTRACTION PHACO AND INTRAOCULAR LENS PLACEMENT (IOC) LEFT DIABETIC;  Surgeon: Nevada Crane, MD;  Location: [facility];  Service: Ophthalmology;  Laterality: Left;  7.55 0:45.2   COLONOSCOPY     COLONOSCOPY WITH PROPOFOL N/A 12/10/2022   Procedure: COLONOSCOPY WITH PROPOFOL;  Surgeon: Regis Bill, MD;  Location: [facility];  Service: Endoscopy;  Laterality: N/A;   CRANIOTOMY FOR TUMOR     ESOPHAGOGASTRODUODENOSCOPY      Prior to Admission medications   Medication Sig Start Date End Date Taking? Authorizing Provider  amLODipine (NORVASC) 5 MG tablet Take 5 mg by mouth daily.   Yes [provider]  atorvastatin (LIPITOR) 10 MG tablet Take 10 mg by mouth daily.   Yes [provider]  Continuous Glucose Receiver (DEXCOM G6 RECEIVER) DEVI by Does not apply route in the morning, at noon, in the evening, and at bedtime.   Yes [provider]  cyanocobalamin (VITAMIN B12) 1000 MCG tablet Take 1,000 mcg by mouth daily.   Yes [provider]  insulin glargine (LANTUS) 100 UNIT/ML injection Inject 2 Units into the skin at bedtime.   Yes [provider]  insulin lispro (HUMALOG) 100 UNIT/ML  injection Inject 2 Units into the skin 3 (three) times daily before meals.   Yes [provider]  lipase/protease/amylase (CREON) 12000-38000 units CPEP capsule Take 24,000 Units by mouth 3 (three) times daily before meals.   Yes [provider]  pantoprazole (PROTONIX) 40 MG tablet Take 40 mg by mouth daily.   Yes [provider]    Allergies as of 01/17/2023   (No Known Allergies)    History reviewed. No pertinent family history.  Social History   Socioeconomic History   Marital status: Married    Spouse name: Not on file   Number of children: Not on file   Years of education: Not on file   Highest education level: Not on file  Occupational History   Not on file  Tobacco Use   Smoking status: Every Day    Current packs/day: 0.50    Average packs/day: 0.5 packs/day for 52.1 years (26.0 ttl pk-yrs)    Types: Cigarettes    Start date: 62   Smokeless tobacco: Never  Vaping Use   Vaping status: Never Used  Substance and Sexual Activity   Alcohol use: Yes    Comment: occ   Drug use: Never   Sexual activity: Not on file  Other Topics Concern   Not on file  Social History Narrative   Not on file   Social Drivers of Health   Financial Resource Strain: Low Risk  (12/27/2022)   Received from [facility]   Overall Financial Resource Strain (CARDIA)    Difficulty of Paying Living Expenses: Not hard at all  Food Insecurity: No Food Insecurity (12/27/2022)   Received from [facility]   Hunger Vital Sign    Worried About Running Out of Food in the Last Year: Never true    Ran Out of Food in the Last Year: Never true  Transportation Needs: No Transportation Needs (12/27/2022)   Received from [facility] - Transportation    In the past 12 months, has lack of transportation kept you from medical appointments or from getting medications?: No    Lack of Transportation (Non-Medical): No   Physical Activity: Not on file  Stress: Not on file  Social Connections: Not on file  Intimate Partner Violence: Not on file    Review of Systems: See HPI, otherwise negative ROS  Physical Exam: BP (!) 142/87   Pulse (!) 46   Temp 98.1 F (36.7 C) (Temporal)   Resp 15   Ht 5' 7.01" (1.702 m)   Wt 61.9 kg   SpO2 100%   BMI 21.36 kg/m  General:   Alert, cooperative in NAD Head:  Normocephalic and atraumatic. Respiratory:  Normal work of breathing. Cardiovascular:  RRR  Impression/Plan: Samuel Lynch is here for cataract surgery.  Risks, benefits, limitations, and alternatives regarding cataract surgery have been reviewed with the patient.  Questions have been answered.  All parties agreeable.   Willey Blade, MD  02/11/2023, 9:08 AM

## 2023-02-12 ENCOUNTER — Encounter: Payer: Self-pay | Admitting: Ophthalmology

## 2023-08-22 ENCOUNTER — Encounter: Payer: Self-pay | Admitting: *Deleted

## 2023-09-02 ENCOUNTER — Ambulatory Visit: Admitting: Anesthesiology

## 2023-09-02 ENCOUNTER — Ambulatory Visit
Admission: RE | Admit: 2023-09-02 | Discharge: 2023-09-02 | Disposition: A | Discharge status: Home / Self Care | Payer: 59 | Attending: Gastroenterology | Admitting: Gastroenterology

## 2023-09-02 ENCOUNTER — Other Ambulatory Visit: Payer: Self-pay

## 2023-09-02 ENCOUNTER — Encounter
Admission: RE | Disposition: A | Discharge status: Home / Self Care | Payer: Self-pay | Source: Home / Self Care | Attending: Gastroenterology

## 2023-09-02 DIAGNOSIS — Z90411 Acquired partial absence of pancreas: Secondary | ICD-10-CM | POA: Diagnosis not present

## 2023-09-02 DIAGNOSIS — D123 Benign neoplasm of transverse colon: Secondary | ICD-10-CM | POA: Diagnosis not present

## 2023-09-02 DIAGNOSIS — Z794 Long term (current) use of insulin: Secondary | ICD-10-CM | POA: Diagnosis not present

## 2023-09-02 DIAGNOSIS — Z8507 Personal history of malignant neoplasm of pancreas: Secondary | ICD-10-CM | POA: Insufficient documentation

## 2023-09-02 DIAGNOSIS — F17200 Nicotine dependence, unspecified, uncomplicated: Secondary | ICD-10-CM | POA: Insufficient documentation

## 2023-09-02 DIAGNOSIS — K640 First degree hemorrhoids: Secondary | ICD-10-CM | POA: Insufficient documentation

## 2023-09-02 DIAGNOSIS — Z1211 Encounter for screening for malignant neoplasm of colon: Secondary | ICD-10-CM | POA: Insufficient documentation

## 2023-09-02 DIAGNOSIS — I10 Essential (primary) hypertension: Secondary | ICD-10-CM | POA: Diagnosis not present

## 2023-09-02 DIAGNOSIS — E109 Type 1 diabetes mellitus without complications: Secondary | ICD-10-CM | POA: Diagnosis not present

## 2023-09-02 DIAGNOSIS — Z800 Family history of malignant neoplasm of digestive organs: Secondary | ICD-10-CM | POA: Insufficient documentation

## 2023-09-02 DIAGNOSIS — R195 Other fecal abnormalities: Secondary | ICD-10-CM | POA: Diagnosis present

## 2023-09-02 DIAGNOSIS — K219 Gastro-esophageal reflux disease without esophagitis: Secondary | ICD-10-CM | POA: Insufficient documentation

## 2023-09-02 DIAGNOSIS — Z79899 Other long term (current) drug therapy: Secondary | ICD-10-CM | POA: Diagnosis not present

## 2023-09-02 HISTORY — PX: SUBMUCOSAL INJECTION: SHX5543

## 2023-09-02 HISTORY — PX: COLONOSCOPY WITH PROPOFOL: SHX5780

## 2023-09-02 HISTORY — PX: POLYPECTOMY: SHX149

## 2023-09-02 LAB — GLUCOSE, CAPILLARY: Glucose-Capillary: 59 mg/dL — ABNORMAL LOW (ref 70–99)

## 2023-09-02 SURGERY — COLONOSCOPY WITH PROPOFOL
Anesthesia: General

## 2023-09-02 MED ORDER — PROPOFOL 500 MG/50ML IV EMUL
INTRAVENOUS | Status: DC | PRN
  Administered 2023-09-02: 75 ug/kg/min via INTRAVENOUS

## 2023-09-02 MED ORDER — DEXTROSE 50 % IV SOLN
INTRAVENOUS | Status: AC
  Filled 2023-09-02: qty 50

## 2023-09-02 MED ORDER — EPHEDRINE SULFATE-NACL 50-0.9 MG/10ML-% IV SOSY
PREFILLED_SYRINGE | INTRAVENOUS | Status: DC | PRN
  Administered 2023-09-02: 15 mg via INTRAVENOUS
  Administered 2023-09-02: 10 mg via INTRAVENOUS

## 2023-09-02 MED ORDER — LIDOCAINE HCL (CARDIAC) PF 100 MG/5ML IV SOSY: PREFILLED_SYRINGE | INTRAVENOUS | Status: DC | PRN

## 2023-09-02 MED ORDER — GLYCOPYRROLATE 0.2 MG/ML IJ SOLN
INTRAMUSCULAR | Status: DC | PRN
Start: 2023-09-02 — End: 2023-09-02
  Administered 2023-09-02: .2 mg via INTRAVENOUS

## 2023-09-02 MED ORDER — DEXMEDETOMIDINE HCL IN NACL 80 MCG/20ML IV SOLN
INTRAVENOUS | Status: DC | PRN
  Administered 2023-09-02: 4 ug via INTRAVENOUS
  Administered 2023-09-02 (×2): 8 ug via INTRAVENOUS

## 2023-09-02 MED ORDER — LIDOCAINE HCL (CARDIAC) PF 100 MG/5ML IV SOSY
PREFILLED_SYRINGE | INTRAVENOUS | Status: DC | PRN
  Administered 2023-09-02: 40 mg via INTRAVENOUS

## 2023-09-02 MED ORDER — DEXTROSE 50 % IV SOLN
25.0000 mL | Freq: Once | INTRAVENOUS | Status: AC
  Administered 2023-09-02: 25 mL via INTRAVENOUS

## 2023-09-02 MED ORDER — PROPOFOL 10 MG/ML IV BOLUS
INTRAVENOUS | Status: DC | PRN
  Administered 2023-09-02: 50 mg via INTRAVENOUS
  Administered 2023-09-02: 20 mg via INTRAVENOUS
  Administered 2023-09-02: 30 mg via INTRAVENOUS

## 2023-09-02 MED ORDER — SPOT INK MARKER SYRINGE KIT
PACK | SUBMUCOSAL | Status: DC | PRN
  Administered 2023-09-02: 1 mL via SUBMUCOSAL

## 2023-09-02 MED ORDER — SODIUM CHLORIDE 0.9 % IV SOLN
INTRAVENOUS | Status: DC
  Administered 2023-09-02: 500 mL via INTRAVENOUS

## 2023-09-02 NOTE — Anesthesia Preprocedure Evaluation (Signed)
 Anesthesia Evaluation  Patient identified by MRN, date of birth, ID band Patient awake    Reviewed: Allergy & Precautions, NPO status , Patient's Chart, lab work & pertinent test results  Airway Mallampati: III  TM Distance: >3 FB Neck ROM: full    Dental  (+) Chipped, Dental Advidsory Given, Missing   Pulmonary neg shortness of breath, neg COPD, neg recent URI, Current Smoker and Patient abstained from smoking.   Pulmonary exam normal        Cardiovascular hypertension, On Medications (-) angina (-) Past MI and (-) Cardiac Stents Normal cardiovascular exam(-) dysrhythmias (-) Valvular Problems/Murmurs     Neuro/Psych negative neurological ROS  negative psych ROS   GI/Hepatic Neg liver ROS,GERD  ,,  Endo/Other  diabetes    Renal/GU negative Renal ROS  negative genitourinary   Musculoskeletal   Abdominal   Peds  Hematology negative hematology ROS (+)   Anesthesia Other Findings Past Medical History: No date: DDD (degenerative disc disease), lumbosacral No date: Diabetes mellitus without complication (HCC) No date: Hypertension No date: Hypokalemia No date: Malignant neoplasm of head of pancreas (HCC) No date: Obstructive jaundice No date: Skull lesion  Past Surgical History: No date: COLONOSCOPY No date: CRANIOTOMY FOR TUMOR No date: ESOPHAGOGASTRODUODENOSCOPY  BMI    Body Mass Index: 20.49 kg/m      Reproductive/Obstetrics negative OB ROS                              Anesthesia Physical Anesthesia Plan  ASA: 2  Anesthesia Plan: General   Post-op Pain Management: Minimal or no pain anticipated   Induction: Intravenous  PONV Risk Score and Plan: 3 and Propofol  infusion and TIVA  Airway Management Planned: Nasal Cannula and Natural Airway  Additional Equipment: None  Intra-op Plan:   Post-operative Plan:   Informed Consent: I have reviewed the patients History and  Physical, chart, labs and discussed the procedure including the risks, benefits and alternatives for the proposed anesthesia with the patient or authorized representative who has indicated his/her understanding and acceptance.     Dental advisory given  Plan Discussed with: CRNA and Surgeon  Anesthesia Plan Comments: (Discussed risks of anesthesia with patient, including possibility of difficulty with spontaneous ventilation under anesthesia necessitating airway intervention, PONV, and rare risks such as cardiac or respiratory or neurological events, and allergic reactions. Discussed the role of CRNA in patient's perioperative care. Patient understands.)        Anesthesia Quick Evaluation

## 2023-09-02 NOTE — H&P (Signed)
 Outpatient short stay form Pre-procedure 09/02/2023  Ole ONEIDA Schick, MD  Primary Physician: Epifanio Alm SQUIBB, MD  Reason for visit:  History of positive cologuard/suboptimal prep  History of present illness:    68 y/o gentleman with history of hypertension and pancreatic cancer s/p whipple surgery. No blood thinners. Brother had colon cancer around 35. Last colonoscopy in November of last year with inadequate prep.    Current Facility-Administered Medications:    0.9 %  sodium chloride  infusion, , Intravenous, Continuous, Rochelle Nephew, Ole ONEIDA, MD   dextrose  50 % solution 25 mL, 25 mL, Intravenous, Once, Dario Barter, MD  Medications Prior to Admission  Medication Sig Dispense Refill Last Dose/Taking   amLODipine (NORVASC) 5 MG tablet Take 5 mg by mouth daily.   09/02/2023 at  6:15 AM   insulin glargine (LANTUS) 100 UNIT/ML injection Inject 2 Units into the skin at bedtime.   09/01/2023   atorvastatin (LIPITOR) 10 MG tablet Take 10 mg by mouth daily.   08/31/2023   Continuous Glucose Receiver (DEXCOM G6 RECEIVER) DEVI by Does not apply route in the morning, at noon, in the evening, and at bedtime.      cyanocobalamin (VITAMIN B12) 1000 MCG tablet Take 1,000 mcg by mouth daily.   08/31/2023   insulin lispro (HUMALOG) 100 UNIT/ML injection Inject 2 Units into the skin 3 (three) times daily before meals.   08/31/2023   lipase/protease/amylase (CREON) 12000-38000 units CPEP capsule Take 24,000 Units by mouth 3 (three) times daily before meals.   08/31/2023   pantoprazole (PROTONIX) 40 MG tablet Take 40 mg by mouth daily.   08/31/2023     No Known Allergies   Past Medical History:  Diagnosis Date   DDD (degenerative disc disease), lumbosacral    Diabetes mellitus without complication (HCC)    GERD (gastroesophageal reflux disease)    Hypertension    Hypokalemia    Malignant neoplasm of head of pancreas (HCC)    Meningioma (HCC)    Neuropathy    Hands and feet.  Secondary to  radiation   Obstructive jaundice    Skull lesion    Type 1 diabetes mellitus (HCC)     Review of systems:  Otherwise negative.    Physical Exam  Gen: Alert, oriented. Appears stated age.  HEENT: PERRLA. Lungs: No respiratory distress CV: RRR Abd: soft, benign, no masses Ext: No edema    Planned procedures: Proceed with colonoscopy. The patient understands the nature of the planned procedure, indications, risks, alternatives and potential complications including but not limited to bleeding, infection, perforation, damage to internal organs and possible oversedation/side effects from anesthesia. The patient agrees and gives consent to proceed.  Please refer to procedure notes for findings, recommendations and patient disposition/instructions.     Ole ONEIDA Schick, MD [facility] Gastroenterology

## 2023-09-02 NOTE — Transfer of Care (Signed)
 Immediate Anesthesia Transfer of Care Note  Patient: Samuel Lynch  Procedure(s) Performed: COLONOSCOPY WITH PROPOFOL  POLYPECTOMY, INTESTINE INJECTION, SUBMUCOSAL  Patient Location: PACU  Anesthesia Type:General  Level of Consciousness: sedated  Airway & Oxygen Therapy: Patient Spontanous Breathing  Post-op Assessment: Report given to RN and Post -op Vital signs reviewed and stable  Post vital signs: Reviewed and stable  Last Vitals:  Vitals Value Taken Time  BP    Temp    Pulse    Resp 22 09/02/23 10:28  SpO2    Vitals shown include unfiled device data.  Last Pain:  Vitals:   09/02/23 0919  TempSrc: Tympanic  PainSc: 0-No pain      Patients Stated Pain Goal: 3 (09/02/23 0919)  Complications: No notable events documented.

## 2023-09-02 NOTE — Op Note (Signed)
 [facility] Gastroenterology Patient Name: Samuel Lynch Procedure Date: 09/02/2023 9:48 AM MRN: [id number] Account #: [id number] Date of Birth: 1955-04-29 Admit Type: Outpatient Age: 68 Room: [facility] ENDO ROOM 3 Gender: Male Note Status: Finalized Instrument Name: Colon Scope [id number] Procedure:             Colonoscopy Indications:           Screening in patient at increased risk: Family history                         of 1st-degree relative with colorectal cancer, Last                         colonoscopy 1 year ago, Incidental - Positive                         Cologuard test Providers:             Ole Schick MD, MD Referring MD:          Alm Needle (Referring MD) Medicines:             Monitored Anesthesia Care Complications:         No immediate complications. Estimated blood loss:                         Minimal. Procedure:             Pre-Anesthesia Assessment:                        - Prior to the procedure, a History and Physical was                         performed, and patient medications and allergies were                         reviewed. The patient is competent. The risks and                         benefits of the procedure and the sedation options and                         risks were discussed with the patient. All questions                         were answered and informed consent was obtained.                         Patient identification and proposed procedure were                         verified by the physician, the nurse, the                         anesthesiologist, the anesthetist and the technician                         in the endoscopy suite. Mental Status Examination:  alert and oriented. Airway Examination: normal                         oropharyngeal airway and neck mobility. Respiratory                         Examination: clear to auscultation. CV Examination:                         normal.  Prophylactic Antibiotics: The patient does not                         require prophylactic antibiotics. Prior                         Anticoagulants: The patient has taken no anticoagulant                         or antiplatelet agents. ASA Grade Assessment: II - A                         patient with mild systemic disease. After reviewing                         the risks and benefits, the patient was deemed in                         satisfactory condition to undergo the procedure. The                         anesthesia plan was to use monitored anesthesia care                         (MAC). Immediately prior to administration of                         medications, the patient was re-assessed for adequacy                         to receive sedatives. The heart rate, respiratory                         rate, oxygen saturations, blood pressure, adequacy of                         pulmonary ventilation, and response to care were                         monitored throughout the procedure. The physical                         status of the patient was re-assessed after the                         procedure.                        After obtaining informed consent, the colonoscope was  passed under direct vision. Throughout the procedure,                         the patient's blood pressure, pulse, and oxygen                         saturations were monitored continuously. The                         Colonoscope was introduced through the anus and                         advanced to the the cecum, identified by appendiceal                         orifice and ileocecal valve. The colonoscopy was                         performed without difficulty. The patient tolerated                         the procedure well. The quality of the bowel                         preparation was fair. The ileocecal valve, appendiceal                         orifice, and rectum were  photographed. Findings:      The perianal and digital rectal examinations were normal.      A 20 mm polyp was found in the transverse colon. The polyp was [place]       classification Is (protruding, sessile). Preparations were made for       mucosal resection. Demarcation of the lesion was performed with narrow       band imaging to clearly identify the boundaries of the lesion. Eleview       was injected to raise the lesion. Snare mucosal resection was performed.       Resection and retrieval were complete. Resected tissue margins were       examined and clear of polyp tissue. To prevent bleeding after the       polypectomy, two hemostatic clips were successfully placed. There was no       bleeding during, or at the end, of the procedure. Area was tattooed with       an injection of 0.5 mL of India ink about 2-3 cm from polypectomy site.      Internal hemorrhoids were found during retroflexion. The hemorrhoids       were Grade I (internal hemorrhoids that do not prolapse).      The exam was otherwise without abnormality on direct and retroflexion       views. Impression:            - Preparation of the colon was fair.                        - One 20 mm polyp in the transverse colon, removed                         with mucosal resection. Resected and retrieved.  Clips                         were placed. Tattooed.                        - Internal hemorrhoids.                        - The examination was otherwise normal on direct and                         retroflexion views.                        - Mucosal resection was performed. Resection and                         retrieval were complete. Recommendation:        - Discharge patient to home.                        - Resume previous diet.                        - Continue present medications.                        - Await pathology results.                        - Repeat colonoscopy in 1-2 years because the bowel                          preparation was suboptimal.                        - Return to referring physician as previously                         scheduled. Procedure Code(s):     --- Professional ---                        [id number], Colonoscopy, flexible; with endoscopic mucosal                         resection Diagnosis Code(s):     --- Professional ---                        Z80.0, Family history of malignant neoplasm of                         digestive organs                        K64.0, First degree hemorrhoids                        D12.3, Benign neoplasm of transverse colon (hepatic                         flexure or splenic flexure) CPT copyright 2022 American Medical Association. All rights reserved.  The codes documented in this report are preliminary and upon coder review may  be revised to meet current compliance requirements. Ole Schick MD, MD 09/02/2023 10:32:21 AM Number of Addenda: 0 Note Initiated On: 09/02/2023 9:48 AM Scope Withdrawal Time: 0 hours 23 minutes 31 seconds  Total Procedure Duration: 0 hours 28 minutes 49 seconds  Estimated Blood Loss:  Estimated blood loss was minimal.      [facility]

## 2023-09-02 NOTE — Interval H&P Note (Signed)
 History and Physical Interval Note:  09/02/2023 9:34 AM  Samuel Lynch  has presented today for surgery, with the diagnosis of + Cologuard.  The various methods of treatment have been discussed with the patient and family. After consideration of risks, benefits and other options for treatment, the patient has consented to  Procedure(s): COLONOSCOPY WITH PROPOFOL  (N/A) as a surgical intervention.  The patient's history has been reviewed, patient examined, no change in status, stable for surgery.  I have reviewed the patient's chart and labs.  Questions were answered to the patient's satisfaction.     Ole ONEIDA Schick  Ok to proceed with colonoscopy

## 2023-09-02 NOTE — Anesthesia Postprocedure Evaluation (Signed)
 Anesthesia Post Note  Patient: Samuel Lynch  Procedure(s) Performed: COLONOSCOPY WITH PROPOFOL  POLYPECTOMY, INTESTINE INJECTION, SUBMUCOSAL  Patient location during evaluation: Endoscopy Anesthesia Type: General Level of consciousness: awake and alert Pain management: pain level controlled Vital Signs Assessment: post-procedure vital signs reviewed and stable Respiratory status: spontaneous breathing, nonlabored ventilation, respiratory function stable and patient connected to nasal cannula oxygen Cardiovascular status: blood pressure returned to baseline and stable Postop Assessment: no apparent nausea or vomiting Anesthetic complications: no   No notable events documented.   Last Vitals:  Vitals:   09/02/23 1038 09/02/23 1048  BP: 127/87 125/73  Pulse:    Resp:    Temp:    SpO2: 99%     Last Pain:  Vitals:   09/02/23 1048  TempSrc:   PainSc: 0-No pain                 Prentice Murphy

## 2023-09-03 LAB — SURGICAL PATHOLOGY
